# Patient Record
Sex: Female | Born: 1975 | Race: White | Hispanic: No | Marital: Married | Smoking: Never smoker
Health system: Southern US, Community
[De-identification: ages and names within clinical notes are randomized; demographics above are authoritative.]

## PROBLEM LIST (undated history)

## (undated) DIAGNOSIS — Z9081 Acquired absence of spleen: Secondary | ICD-10-CM

## (undated) DIAGNOSIS — E7522 Gaucher disease: Secondary | ICD-10-CM

## (undated) HISTORY — DX: Gaucher disease: E75.22

## (undated) HISTORY — PX: SPLENECTOMY, PARTIAL: SHX787

## (undated) HISTORY — PX: TOE SURGERY: SHX1073

## (undated) HISTORY — PX: WISDOM TOOTH EXTRACTION: SHX21

---
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
## 2000-02-20 ENCOUNTER — Encounter (HOSPITAL_COMMUNITY): Admission: RE | Admit: 2000-02-20 | Discharge: 2000-05-20 | Payer: Self-pay | Admitting: Family Medicine

## 2000-07-21 ENCOUNTER — Other Ambulatory Visit: Admission: RE | Admit: 2000-07-21 | Discharge: 2000-07-21 | Payer: Self-pay | Admitting: Obstetrics & Gynecology

## 2001-07-14 ENCOUNTER — Other Ambulatory Visit: Admission: RE | Admit: 2001-07-14 | Discharge: 2001-07-14 | Payer: Self-pay | Admitting: Obstetrics and Gynecology

## 2002-03-04 ENCOUNTER — Ambulatory Visit (HOSPITAL_COMMUNITY): Admission: RE | Admit: 2002-03-04 | Discharge: 2002-03-04 | Payer: Self-pay | Admitting: Obstetrics and Gynecology

## 2002-03-04 ENCOUNTER — Encounter: Payer: Self-pay | Admitting: Obstetrics and Gynecology

## 2002-06-14 ENCOUNTER — Ambulatory Visit (HOSPITAL_COMMUNITY): Admission: RE | Admit: 2002-06-14 | Discharge: 2002-06-14 | Payer: Self-pay | Admitting: Obstetrics and Gynecology

## 2002-06-14 ENCOUNTER — Encounter: Payer: Self-pay | Admitting: Obstetrics and Gynecology

## 2002-07-24 ENCOUNTER — Inpatient Hospital Stay (HOSPITAL_COMMUNITY): Admission: AD | Admit: 2002-07-24 | Discharge: 2002-07-27 | Payer: Self-pay | Admitting: Obstetrics and Gynecology

## 2002-07-24 ENCOUNTER — Inpatient Hospital Stay (HOSPITAL_COMMUNITY): Admission: AD | Admit: 2002-07-24 | Discharge: 2002-07-24 | Payer: Self-pay | Admitting: Obstetrics and Gynecology

## 2002-08-31 ENCOUNTER — Other Ambulatory Visit: Admission: RE | Admit: 2002-08-31 | Discharge: 2002-08-31 | Payer: Self-pay | Admitting: Obstetrics and Gynecology

## 2003-09-15 ENCOUNTER — Other Ambulatory Visit: Admission: RE | Admit: 2003-09-15 | Discharge: 2003-09-15 | Payer: Self-pay | Admitting: Obstetrics and Gynecology

## 2004-08-27 ENCOUNTER — Other Ambulatory Visit: Admission: RE | Admit: 2004-08-27 | Discharge: 2004-08-27 | Payer: Self-pay | Admitting: Obstetrics and Gynecology

## 2005-05-14 ENCOUNTER — Ambulatory Visit: Payer: Self-pay | Admitting: Internal Medicine

## 2005-08-07 ENCOUNTER — Other Ambulatory Visit: Admission: RE | Admit: 2005-08-07 | Discharge: 2005-08-07 | Payer: Self-pay | Admitting: Obstetrics and Gynecology

## 2005-11-25 HISTORY — PX: DILATION AND CURETTAGE OF UTERUS: SHX78

## 2006-10-02 ENCOUNTER — Other Ambulatory Visit: Admission: RE | Admit: 2006-10-02 | Discharge: 2006-10-02 | Payer: Self-pay | Admitting: Obstetrics and Gynecology

## 2006-10-17 ENCOUNTER — Ambulatory Visit (HOSPITAL_COMMUNITY): Admission: AD | Admit: 2006-10-17 | Discharge: 2006-10-17 | Payer: Self-pay | Admitting: Obstetrics and Gynecology

## 2006-10-17 ENCOUNTER — Encounter (INDEPENDENT_AMBULATORY_CARE_PROVIDER_SITE_OTHER): Payer: Self-pay | Admitting: Specialist

## 2007-04-20 ENCOUNTER — Encounter: Payer: Self-pay | Admitting: Internal Medicine

## 2007-06-26 ENCOUNTER — Ambulatory Visit (HOSPITAL_COMMUNITY): Admission: RE | Admit: 2007-06-26 | Discharge: 2007-06-26 | Payer: Self-pay | Admitting: Obstetrics and Gynecology

## 2007-07-17 ENCOUNTER — Ambulatory Visit (HOSPITAL_COMMUNITY): Admission: RE | Admit: 2007-07-17 | Discharge: 2007-07-17 | Payer: Self-pay | Admitting: Obstetrics and Gynecology

## 2007-08-14 ENCOUNTER — Inpatient Hospital Stay (HOSPITAL_COMMUNITY): Admission: AD | Admit: 2007-08-14 | Discharge: 2007-08-14 | Payer: Self-pay | Admitting: Obstetrics and Gynecology

## 2007-08-14 ENCOUNTER — Encounter: Payer: Self-pay | Admitting: Obstetrics and Gynecology

## 2007-09-02 ENCOUNTER — Inpatient Hospital Stay (HOSPITAL_COMMUNITY): Admission: AD | Admit: 2007-09-02 | Discharge: 2007-09-04 | Payer: Self-pay | Admitting: Obstetrics and Gynecology

## 2007-09-03 ENCOUNTER — Encounter: Payer: Self-pay | Admitting: Obstetrics and Gynecology

## 2007-09-06 ENCOUNTER — Inpatient Hospital Stay (HOSPITAL_COMMUNITY): Admission: AD | Admit: 2007-09-06 | Discharge: 2007-09-14 | Payer: Self-pay | Admitting: Obstetrics and Gynecology

## 2007-09-15 ENCOUNTER — Encounter: Admission: RE | Admit: 2007-09-15 | Discharge: 2007-10-15 | Payer: Self-pay | Admitting: Obstetrics and Gynecology

## 2007-10-16 ENCOUNTER — Encounter: Admission: RE | Admit: 2007-10-16 | Discharge: 2007-11-08 | Payer: Self-pay | Admitting: Obstetrics and Gynecology

## 2010-11-13 ENCOUNTER — Emergency Department (HOSPITAL_COMMUNITY)
Admission: EM | Admit: 2010-11-13 | Discharge: 2010-11-14 | Payer: Self-pay | Source: Home / Self Care | Admitting: Emergency Medicine

## 2010-12-16 ENCOUNTER — Encounter: Payer: Self-pay | Admitting: Obstetrics and Gynecology

## 2011-02-04 LAB — RAPID URINE DRUG SCREEN, HOSP PERFORMED
Amphetamines: NOT DETECTED
Barbiturates: NOT DETECTED
Benzodiazepines: NOT DETECTED
Cocaine: NOT DETECTED
Opiates: NOT DETECTED
Tetrahydrocannabinol: NOT DETECTED

## 2011-02-04 LAB — DIFFERENTIAL
Basophils Absolute: 0 10*3/uL (ref 0.0–0.1)
Lymphocytes Relative: 22 % (ref 12–46)
Monocytes Absolute: 0.7 10*3/uL (ref 0.1–1.0)
Neutro Abs: 6.8 10*3/uL (ref 1.7–7.7)

## 2011-02-04 LAB — BASIC METABOLIC PANEL
BUN: 10 mg/dL (ref 6–23)
GFR calc non Af Amer: >60 mL/min (ref >60)

## 2011-02-04 LAB — CBC
HCT: 35.7 % — ABNORMAL LOW (ref 36.0–46.0)
RDW: 12.1 % (ref 11.5–15.5)
WBC: 9.8 10*3/uL (ref 4.0–10.5)

## 2011-02-04 LAB — URINALYSIS, ROUTINE W REFLEX MICROSCOPIC
Ketones, ur: 15 mg/dL — AB
Nitrite: NEGATIVE
Protein, ur: 30 mg/dL — AB
Specific Gravity, Urine: 1.03 (ref 1.005–1.030)
Urobilinogen, UA: 1 mg/dL (ref 0.0–1.0)

## 2011-02-04 LAB — GLUCOSE, CAPILLARY: Glucose-Capillary: 127 mg/dL — ABNORMAL HIGH (ref 70–99)

## 2011-02-08 IMAGING — CT CT HEAD W/O CM
1 series · 16 of 30 positions shown, 20 images · non-contrast
Comparison: None.

CLINICAL DATA: Seizures.

CT HEAD WITHOUT CONTRAST
TECHNIQUE: Contiguous axial images were obtained from the base of
the skull through the vertex without contrast.

[Series 2: head trauma 4.8 h37s · axial · 0.45mm/px · z∈[-93,+36]mm · 16 of 30 slices shown, 20 images]
[im 2/30  brain]
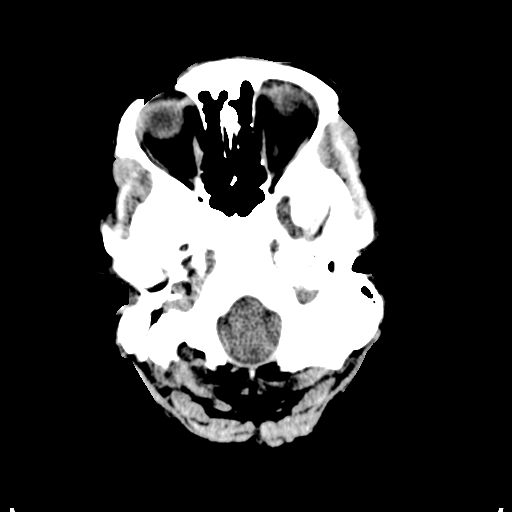
[im 2/30  bone]
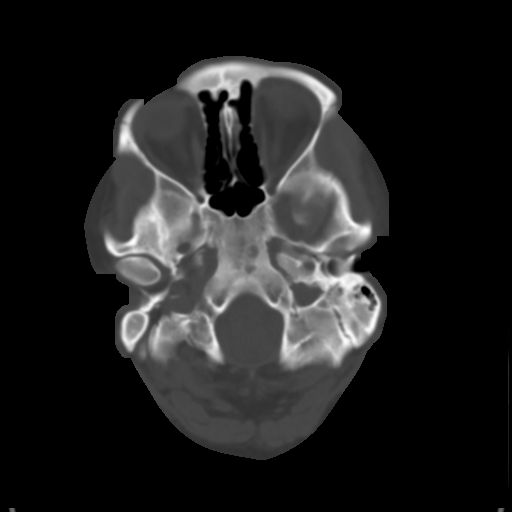
[im 4/30  brain]
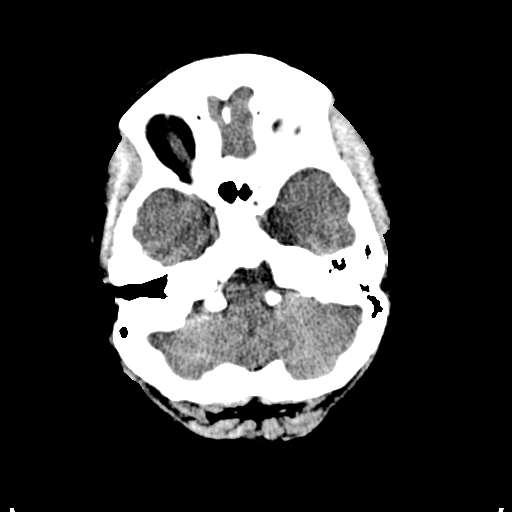
[im 6/30  brain]
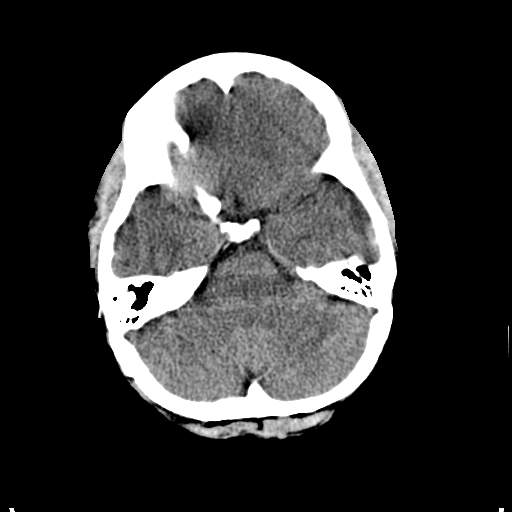
[im 8/30  brain]
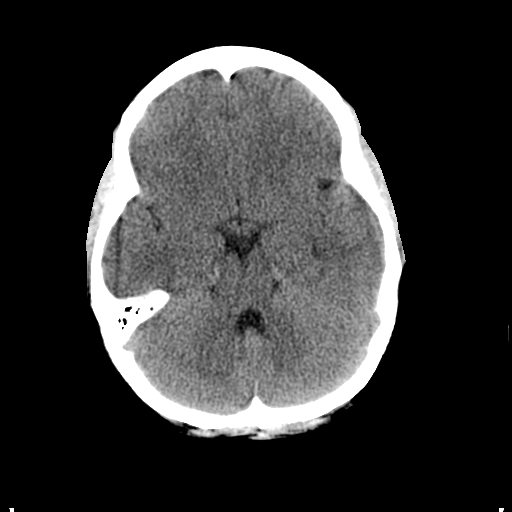
[im 9/30  brain]
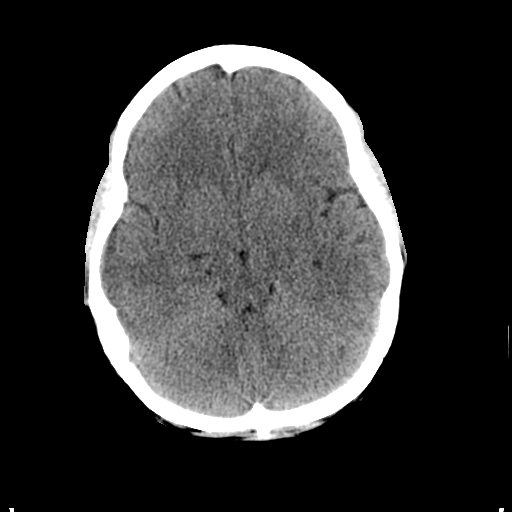
[im 9/30  bone]
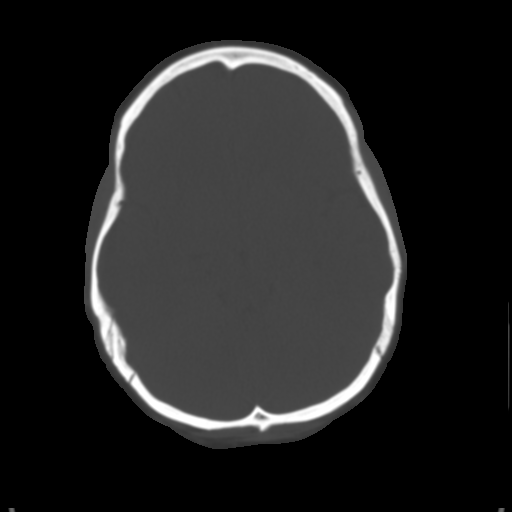
[im 11/30  brain]
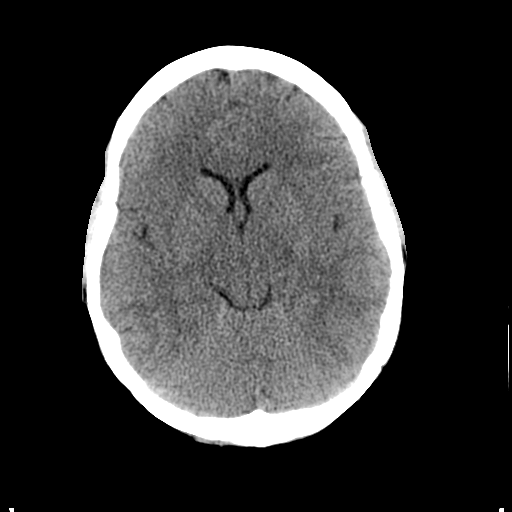
[im 13/30  brain]
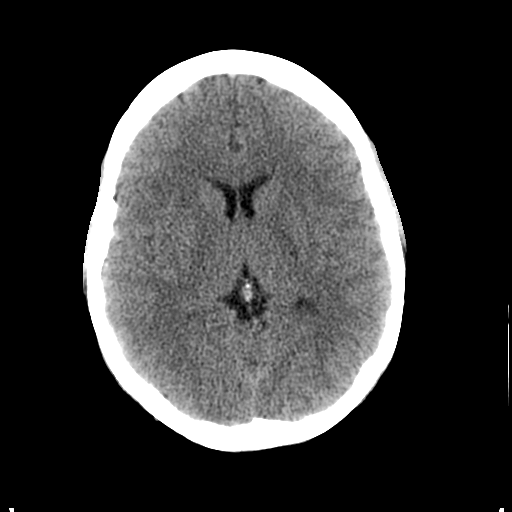
[im 15/30  brain]
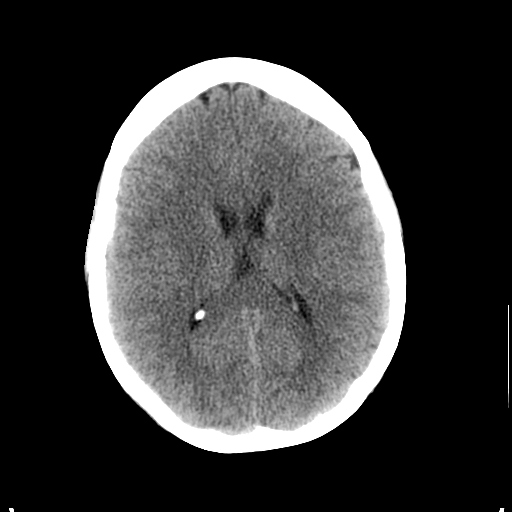
[im 16/30  brain]
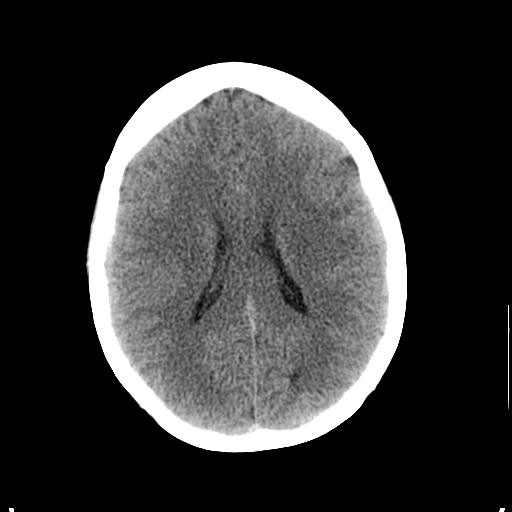
[im 16/30  bone]
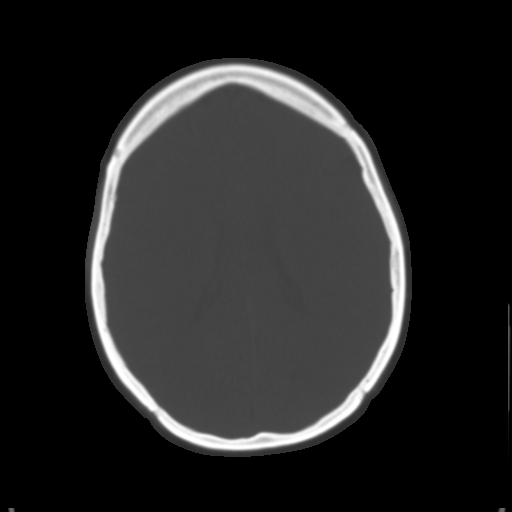
[im 18/30  brain]
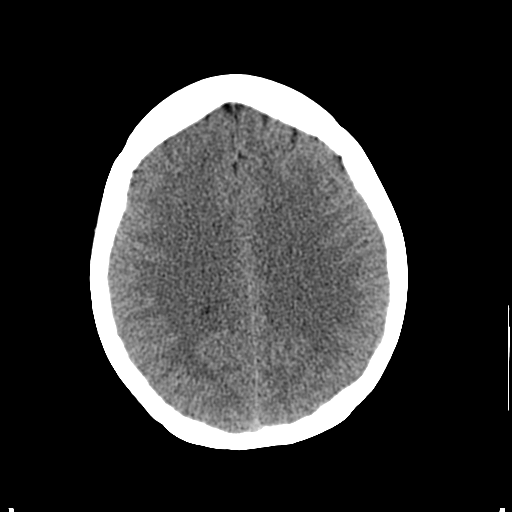
[im 20/30  brain]
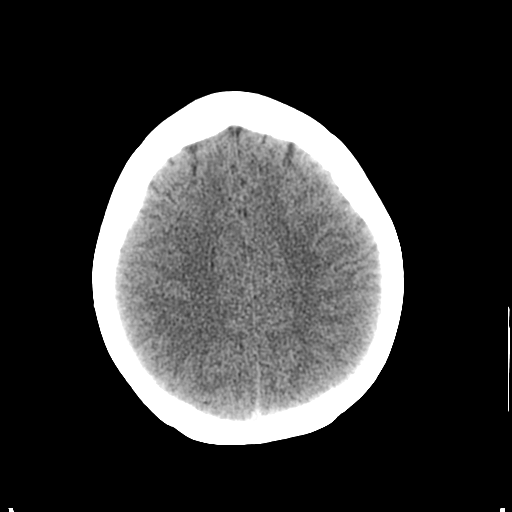
[im 22/30  brain]
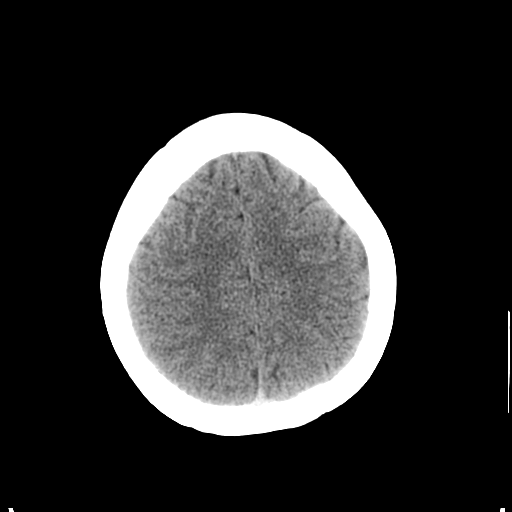
[im 23/30  brain]
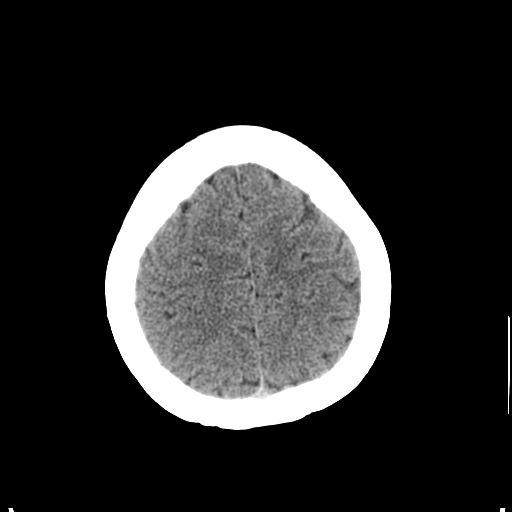
[im 23/30  bone]
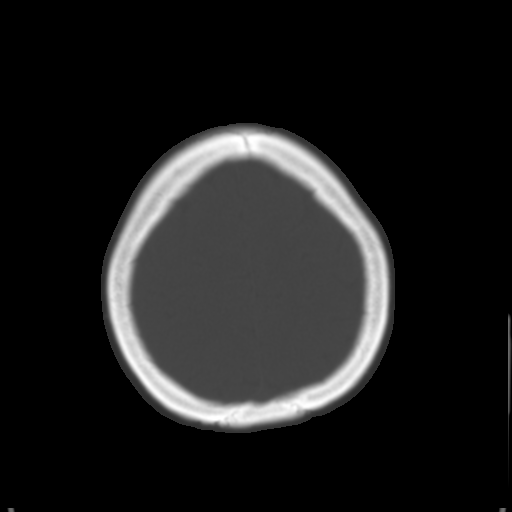
[im 25/30  brain]
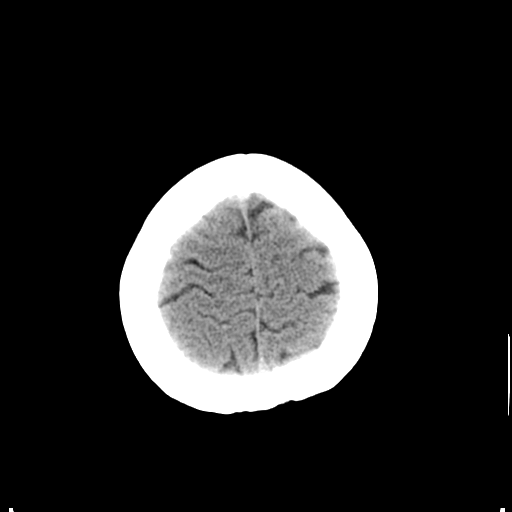
[im 27/30  brain]
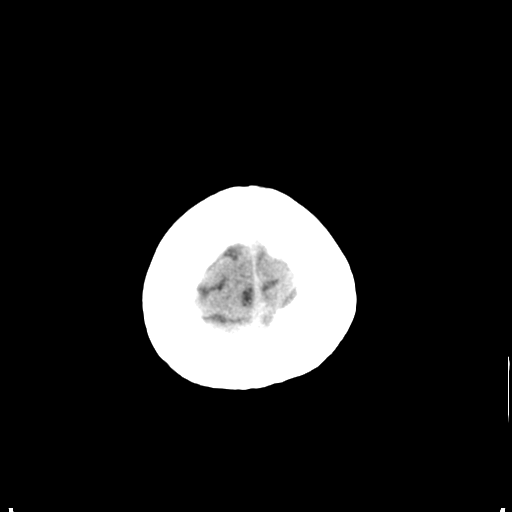
[im 29/30  brain]
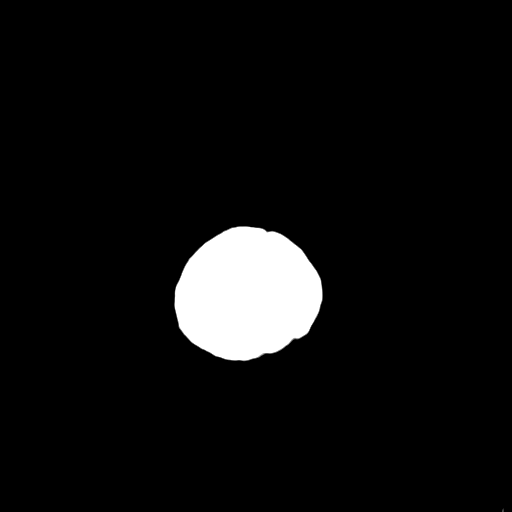

[16 of 30 positions shown; findings below may reference images not displayed]

FINDINGS: There is no evidence of acute infarction, mass lesion, or
intra- or extra-axial hemorrhage on CT.

The posterior fossa, including the cerebellum, brainstem and fourth
ventricle, is within normal limits.  The third and lateral
ventricles, and basal ganglia are unremarkable in appearance.  The
cerebral hemispheres are symmetric in appearance, with normal gray-
white differentiation.  No mass effect or midline shift is seen.

There is no evidence of fracture; visualized osseous structures are
unremarkable in appearance.  The visualized portions of the orbits
are within normal limits.  The paranasal sinuses and mastoid air
cells are well-aerated.  No significant soft tissue abnormalities
are seen.
IMPRESSION: Unremarkable noncontrast CT of the head.

## 2011-02-08 IMAGING — CR DG CHEST 1V
1 series · 1 of 1 positions shown · non-contrast
Comparison: None

CLINICAL DATA: Seizure.

CHEST - 1 VIEW

[w chest pa]
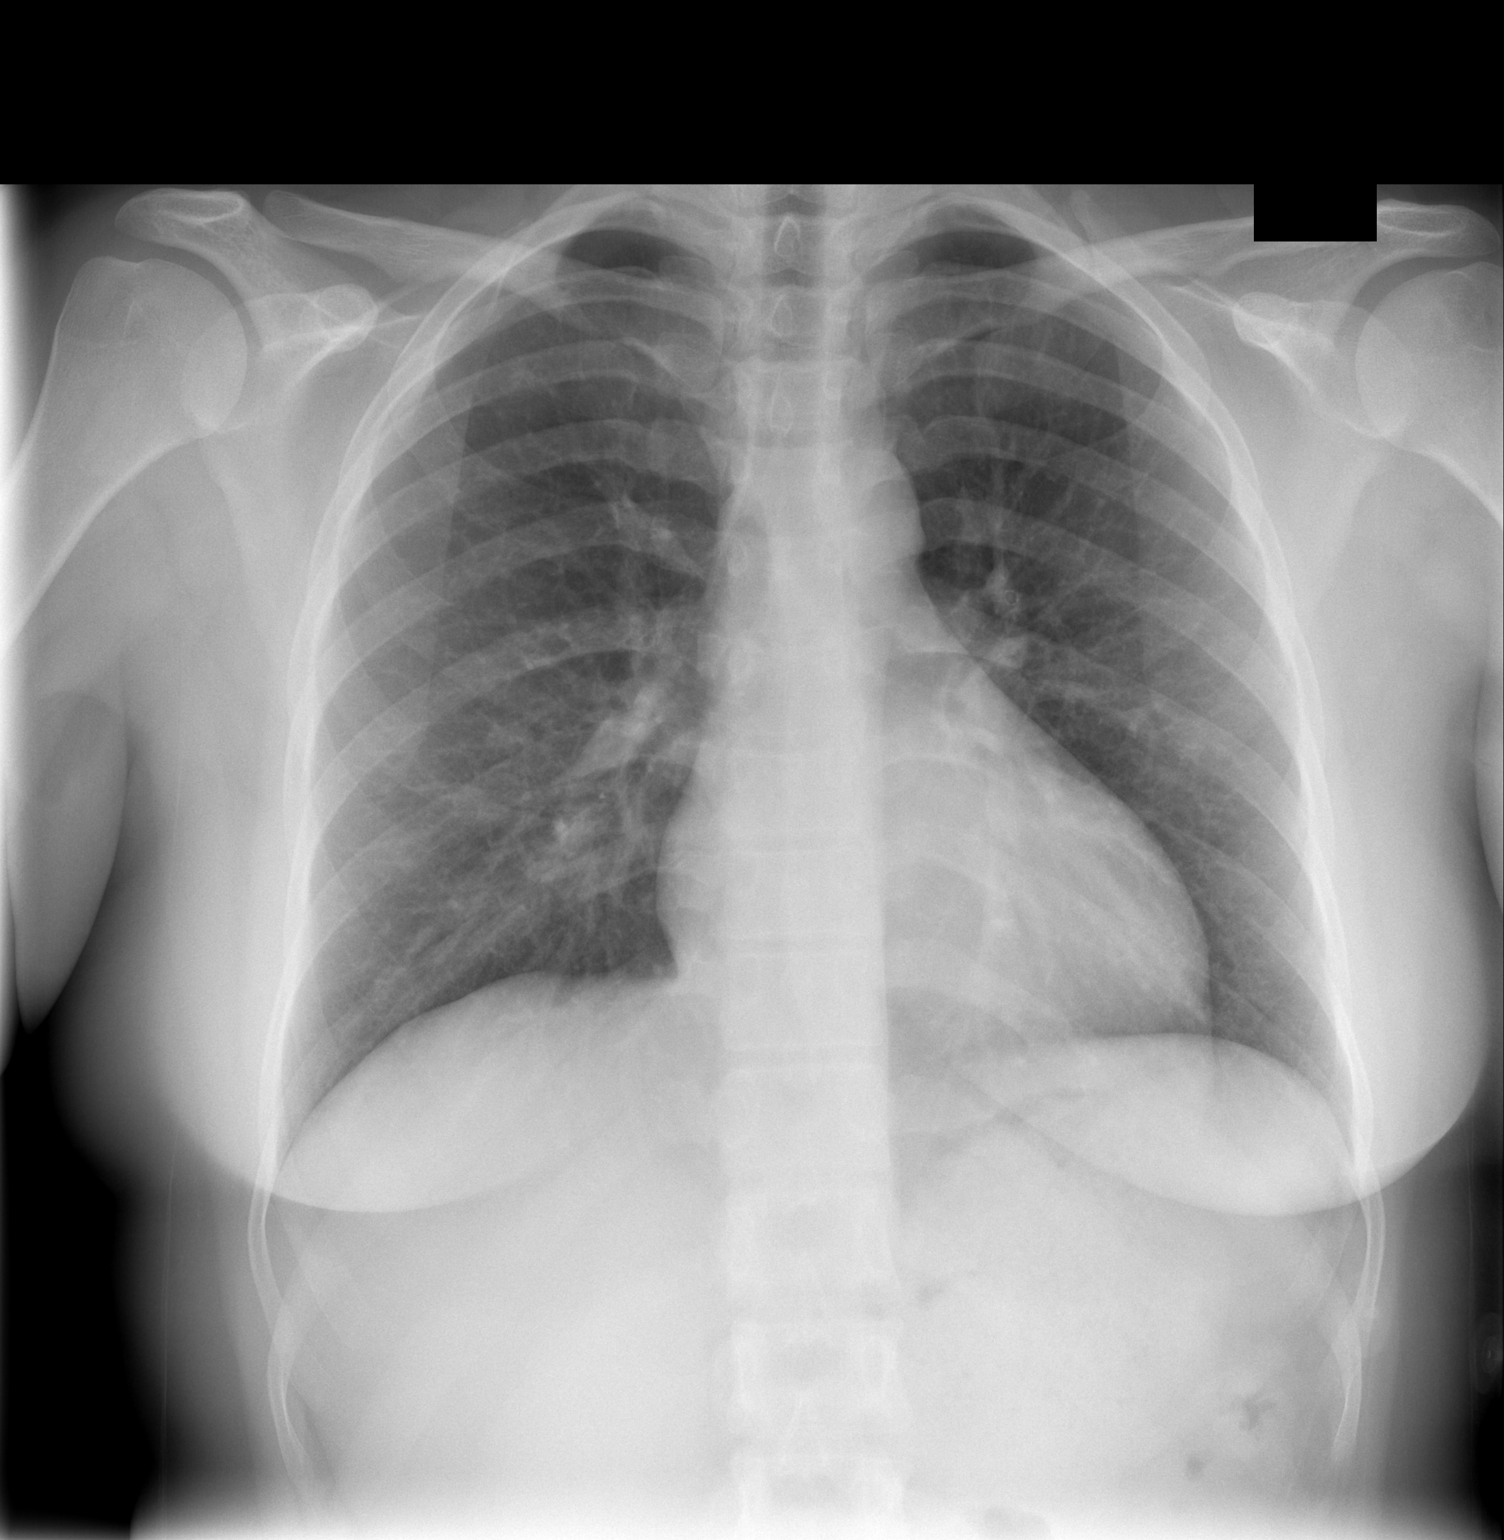

[1 of 1 positions shown; findings below may reference images not displayed]

FINDINGS: Mild peribronchial thickening. Heart and mediastinal
contours are within normal limits.  No focal opacities or
effusions.  No acute bony abnormality.
IMPRESSION: Mild bronchitic changes.

## 2011-04-09 NOTE — H&P (Signed)
NAMESHANEY, DECKMAN              ACCOUNT NO.:  [id number]   MEDICAL RECORD NO.:  [id number]          PATIENT TYPE:  INP   LOCATION:  9157                          FACILITY:  [facility]   PHYSICIAN:  Hal Morales, M.D.DATE OF BIRTH:  04-15-1976   DATE OF ADMISSION:  09/06/2007  DATE OF DISCHARGE:                              HISTORY & PHYSICAL   Ms. Elson is a 35 year old gravida 3, para 1-0-1-1 at 33-6/7 weeks who  presents today for admission secondary to elevated blood pressure.  Her  history has been remarkable for admission on September 02, 2007 to September 04, 2007, with the initial diagnosis of preeclampsia secondary to a 24-  hour urine with 300 mg protein.  She also was undergoing treatment for  UTI and was monitored.  Her blood pressures initially were elevated at  the 140s/90s.  These did then improve.  Uric acid on admission was 8.2,  and her platelet count was 181.  Her uric acid had gone down to 6.6.  Normal growth was noted.  The decision was made to give the patient the  option of going home to monitor her blood pressures t.i.d. and monitor  her weight, strict instructions for calling with specific blood pressure  parameters or noticing PIH symptoms.  The patient was also instructed to  call today for report of her values.   When she reported today, her blood pressures had been yesterday in the  130s/80s.  Today they were in the 140s-150s/90s with a home blood  pressure cuff.  She was brought to maternity admissions for recheck and  while her blood pressures by her home-bought cuff were higher than her  other, her pressures even with a large cuff remained largely in the 90s  diastolically.  She is therefore readmitted for further preeclamptic  care.  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  (2)  History of  partial splenectomy.  (3) Equivocal rubella.  (4)  Mild fetal  ventriculomegaly on  previous ultrasound but now resolved.  (5) Blood  pressures labile during her pregnancy.  (6) Now preeclampsia.   PRENATAL LABS:  Blood type is AB positive, Rh antibody negative, VDR  nonreactive, _rubella titer equivocal, hepatitis B surface antigen  negative, group B strep culture pending, HIV was nonreactive.  The  patient's 1st trimester screen that was normal.  AFP was also normal.  Her hemoglobin upon entering the practice was 11.6, platelet count was  221, CBC is reviewed at 23 weeks normal values, showed normal Glucola.   HISTORY OF PRESENT PREGNANCY:  The patient entered care at 10 weeks.  She had not been using her enzyme treatment for Gaucher's disease in the  1st trimester.  She had a 1st trimester screen which was normal.  She  restarted her Cerezyme at 13 weeks and was followed at [facility].  Her AFP  was normal.  She had an ultrasound at 19 weeks showing normal growth and  normal fluid.  Placenta was low-lying at that time, but cervical length  was normal.  At 23 weeks, she had another ultrasound for normal cervical  length and normal fluid.  Lateral ventricle was slightly enlarged.  She  was sent to maternity fetal modes for a 2nd opinion.  The lateral  ventricle was borderline enlarged on their exam.  Another ultrasound was  repeated at 27 weeks.  The ventricle was within the upper limits of  normal.  Estimated fetal weight was in the 53rd percentile with normal  fluid.  Glucola was given and was normal.  She had mild elevation of her  blood pressure beginning at 30 weeks and was placed on increased rest at  31 weeks.  She saw Dr. Stefano Gaul at that time when she was having some  swelling.  She had no blurred vision or headache.  On September 19, she  had an ultrasound at maternal fetal medicine, and showed a lateral  ventricle only mildly elevated but still within normal limits.  She was  given Ambien for insomnia.  She was placed on bed rest then for  reevaluation later that  week secondary to elevated blood pressures.  A  24-hour urine was done last week showing an elevation which precipitated  her admission.   OBSTETRICAL HISTORY:  1. In 2000, she had a vaginal birth of a female infant, weight 7      pounds 7 ounces, 38-1/2 weeks.  She was in labor 18 hours.  She had      no complications.  2. In 2000, she had a missed AB.   MEDICAL HISTORY:  1. She is a previous oral contraceptive user.  2. She reports the usual childhood illnesses.  3. She has a history of anemia.  4. She has occasional urinary tract infection.  5. She has had pyelonephritis x1.  6. 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   SURGICAL HISTORY:  Partial splenectomy at age 35 and wisdom teeth  removed at age 89.   SHE IS ALLERGIC TO SULFA AND HAS SOME MILD LATEX SENSITIVITY.   FAMILY HISTORY:  Maternal grandmother had an MI.  Maternal grandfather  had an angioplasty.  Multiple family members have hypertension.  Her  mother had anemia.  Maternal cousin has seizures.  A maternal  grandmother had PIH.  Her brother had breast cancer, and a paternal  grandfather had pancreatic cancer.   GENETIC HISTORY:  Remarkable for the previously noted Gaucher's disease.   SOCIAL HISTORY:  The patient is married to the father of the baby.  He  is involved and supportive.  His name is Vaness Jelinski.  The patient is  Caucasian of the [place] faith.  She is college-educated.  She is a  Emergency planning/management officer.  Her husband is also college-educated.  He is a Associate Professor.  She has been followed by the physician service at [facility].  She denies any alcohol, drug, or tobacco use during this  pregnancy.   PHYSICAL EXAM:  Blood pressures today are 140s-160/89-95.  Other vital  signs are stable.  HEENT:  Within normal limits.  LUNGS:  Breath sounds are clear.  HEART:  Regular rate and rhythm without murmur.  BREASTS:  Soft  and nontender.  ABDOMEN:  Fundal height is approximately 34 cm.  Abdomen is soft and  nontender.  There is no right upper quadrant tenderness.  EXTREMITIES:  Deep tendon reflexes are 2+ without clonus.  There is  a  trace to 1+ edema noted.  CERVICAL EXAM:  1-2, 50%, vertex, with a -2 station.  This is  essentially what the cervix has been.  Fetal fibronectin was done.  It  was positive; however, Dr. Pennie Rushing feels that regardless of this  outcome, the patient's labor would not be stopped should she self-  initiate preterm labor.  Fetal heart rate is reactive with no  decelerations and no contractions.   IMPRESSION:  1. Intrauterine pregnancy at 33-6/7 weeks.  2. Preeclampsia.  3. History of Gaucher's disease.   PLAN:  1. Admit to [facility] for consult with Dr.      Dierdre Forth as attending physician.  2. Routine physician orders.  3. Pregnancy-induced hypertension labs to be done.  4. NST q.8h. hours will be done.  5. Close observation for any worsening of any symptoms will be noted.      Renaldo Reel Emilee Hero, C.N.M.      Hal Morales, M.D.  Electronically Signed    VLL/MEDQ  D:  09/06/2007  T:  09/06/2007  Job:  [id number]

## 2011-04-09 NOTE — Discharge Summary (Signed)
Yesenia Nichols, Yesenia Nichols              ACCOUNT NO.:  [id number]   MEDICAL RECORD NO.:  [id number]          PATIENT TYPE:  INP   LOCATION:  9159                          [facility]ACILITY:  [facility]   PHYSICIAN:  Hal Morales, M.D.DATE OF BIRTH:  Aug 27, 1976   DATE OF ADMISSION:  09/02/2007  DATE OF DISCHARGE:  09/04/2007                               DISCHARGE SUMMARY   ADMITTING DIAGNOSES:  1. Intrauterine pregnancy at 33-3/7ths weeks.  2. Proteinuria.  3. Gaucher's disease.   DISCHARGE DIAGNOSES:  1. Intrauterine pregnancy at 33-3/7ths weeks.  2. Proteinuria.  3. Gaucher's disease.   PROCEDURE:  Dexamethasone therapy.   HOSPITAL COURSE:  The patient is a 35 year old gravida 3, para 1, 0, 1,  1 at 33-2/7ths weeks who presented from the office on September 02, 2007  for admission secondary to proteinuria.  She also had an E coli UTI and  her blood pressure was also elevated.  The pregnancy has been remarkable  for:  1. 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  2. History of partial splenectomy.  3. Equivocal rubella.  4. Mild fetal ventriculomegaly on previous ultrasound.  5. Blood pressures labile on previous visits.   On admission the blood pressure was 148/94 and 152/98, and other vital  signs were stable.  Fetal heart rate was reactive with no decelerations.  There were very occasional uterine contractions noted.  Her uric acid  was 8.2 from the office.  Her physical exam was otherwise within normal  limits.  She was admitted to the antenatal unit for consult with Dr.  Normand Sloop.  Continuous monitoring was initiated.  A dexamethasone course  was given.  The patient was scheduled for an ultrasound with a BPP and  AFI by maternal fetal medicine on September 03, 2007.  Repeat PIH labs  were done in the morning.  She was placed on amoxicillin for the UTI  with an initial plan made to repeat a 24-hour urine on September 10, 2007  and with a repeat urine  culture at that time.   Over the rest the 2 days that she was in the hospital the patient's  blood pressure eventually stabilized.  Her CBC on September 03, 2007 was  within normal limits with a platelet count of 181,000.  Her uric acid  was down to 6.6.  Her SGOT and SGPT were normal.   The ultrasound was scheduled.  A NICU consult was obtained.  Ultrasound  on September 03, 2007 showed a BPP of 8/8, normal anatomy, and normal  fluid and growth at the 74th percentile.  Her blood pressures were in  the 120s to 140s over 70s to 93.  Her weight was stable.  Dr. Stefano Gaul  reviewed with the patient the natural history of preeclampsia.  Discussion was held regarding magnesium sulfate therapy and the decision  was made to not administer magnesium at this time.  She completed the  dexamethasone course.  Labs were repeated on September 04, 2007 showing a  hemoglobin of 11.2, platelet count of 156,000, SGOT and SGPT were  normal,  and uric acid was 6.8.   Dr. Ander Slade was consulted from maternal fetal medicine.  The question was  raised regarding outpatient versus inpatient management from  Dr. Ronnell Freshwater  consult.  He, after discussion with the patient, did feel the patient  could be managed at home as long as she monitored her blood pressure and  her weight with a plan made for close antenatal follow up, and the  patient was to be on bedrest.  There were also plans made for antenatal  testing and repeat 24-hour urine, and labs.  The patient initially had  decided not to go home and then decided she did want to.  Dr. Pennie Rushing  was consulted and saw the patient.  The decision was made to discharge  the patient home in light of the recommendations from maternal fetal  medicine and the patient's stability of blood pressure.  The patient was  deemed to have received  full benefit of her hospital stay and was  discharged home.   DISCHARGE INSTRUCTIONS:  1. The patient is to maintain level 2 bedrest.  2. The  patient is to monitor and call for any PIH symptoms.  3. The patient is also to weigh herself daily and take her blood      pressure three times a day.  She is to call with these results on      "Sunday,  September 06, 2007  to the nurse midwife on call.   DISCHARGE MEDICATIONS:  The patient will continue her primary admission  medications which included:  1. Ambien 10 mg 1 by mouth at bedtime as needed.  2. Zantac 150 mg 1 by mouth twice a day.  3. Cerezyme 10 mg IV every 2 weeks.  4. Prenatal vitamin 1 by mouth daily.  5. The patient's new medication is amoxicillin 500 mg by mouth twice a      day for an additional 3 days to complete a total of 5 days therapy.      This prescription was called to the [facility] at [place].   FOLLOW UP:  Discharge follow up is as follows:  1. The patient will have an NST and a visit in the office on October      13" , 2008;  the office will call to schedule this.  2. On September 10, 2007 she will have an ultrasound for BPP, AFI and      Dopplers.  3. The patient is to begin a 24-hour urine on September 09, 2007 to be      completed on the morning of September 10, 2007, and bring this to the      office visit on that day.  4. We will also repeat urine C&S on that day as well and as well we      will repeat PIH labs.   DISCHARGE INSTRUCTIONS:  The patient is to call for headache, blurry  vision or epigastric pain, or blood pressure greater than 150 systolic  of greater than 90 diastolic.  She is also monitor for fetal movement  and any onset of significant contractions.   DISCHARGE CONDITION:  The patient was discharged home in stable  condition.      Renaldo Reel Emilee Hero, C.N.M.      Hal Morales, M.D.  Electronically Signed   VLL/MEDQ  D:  09/04/2007  T:  09/06/2007  Job:  [id number]

## 2011-04-09 NOTE — H&P (Signed)
NAMESANAII, CAPORASO              ACCOUNT NO.:  [id number]   MEDICAL RECORD NO.:  [id number]          PATIENT TYPE:  INP   LOCATION:  9159                          [facility]ILITY[facility]  [facility]   PHYSICIAN:  Naima A. Dillard, M.D. DATE OF BIRTH:  [birth year]   DATE OF ADMISSION:  09/02/2007  DATE OF DISCHARGE:                              HISTORY & PHYSICAL   Ms. Yesenia Nichols is a 35 year old gravida 3, para 1-0-0-1, at 33-2/7 weeks, who  presented from the office for admission secondary to proteinuria.  She  has also had an E. coli UTI.  She has had labile blood pressures and on  her PIH labs that were done recently her uric acid was 8.2.  She denies  any PIH symptoms and reports positive fetal movement.  The pregnancy has  been remarkable for:   1. Gaucher disease.  2. History of partial splenectomy.  3. Equivocal rubella.  4. Mild fetal ventriculomegaly on previous ultrasounds.   PRENATAL LABS:  Blood type is AB positive, Rh antibody negative.  VDRL  nonreactive.  Rubella titer is equivocal.  Hepatitis B surface antigen  is negative.  Group B strep culture is pending.  HIV was nonreactive.  The patient had a first trimester screen that was normal.  AFP was also  normal.  Her hemoglobin upon entering the practice was 11.6, platelet  count was 221.  A CBC was repeated at 23 weeks with normal values.  She  had a normal Glucola.   HISTORY OF PRESENT PREGNANCY:  The patient entered care at 10 weeks.  She had not been using her enzyme treatment for Gaucher disease in the  first trimester.  She did have some significant fatigue during that  time.  She had a first trimester screen, which was normal.  She  restarted her serozyme at 13 weeks and was followed at [facility].  Her AFP  was normal.  She had an ultrasound at 19 weeks showing normal growth and  normal fluid.  Placenta was low-lying at that time.  Cervix was normal.  At 23 weeks she had another ultrasound with normal cervical length,  normal fluid.  The  lateral ventricle was slightly enlarged.  She was  sent to maternal-fetal medicine for a second opinion.  The lateral  ventricle was borderline enlarged.  Another ultrasound was repeated at  27 weeks.  The ventricles were in the upper limits of normal.  Estimated  fetal weight was in the 53rd percentile with normal fluid.  Glucola was  given and was normal.  She had mild elevation of her blood pressure at  30 weeks, began to have some increase rest at 31-2/7 weeks.  She saw Dr.  Stefano Gaul.  She was still having some swelling.  She had no blurred  vision or headache.  On September 19 she had an ultrasound at maternal-  fetal medicine showing the lateral ventricle only mildly elevated but  still was within normal limits.  She was given Ambien for insomnia at  that time.  She was placed on rest and then for reevaluation later in  that week rest.  The rest of her pregnancy has been uncomplicated.  She  did continue to have some lability of her blood pressure and therefore  the 24-hour urine was done showing elevated protein, approximately 300  mg in a 24-hour specimen.  She is therefore admitted for further  evaluation and follow-up.   OBSTETRICAL HISTORY:  In 2003 she had a vaginal birth of a female  infant, weight 7 pounds 7 ounces, at 38-1/2-weeks.  She was in labor 18  hours.  She had no complications.  In 2000 she had a missed AB.   MEDICAL HISTORY:  She is previous oral contraceptive user.  She reports  the usual childhood illnesses.  She does have a history of anemia.  She  had occasional urinary tract infections.  She has had pyelonephritis x1.  She does have a history of Gaucher disease, for which she has been  receiving enzyme treatment on an every 2-week basis.   SURGICAL HISTORY:  A partial splenectomy at age 70 and her wisdom teeth  removed at 87.   She is allergic to SULFA and some mild LATEX sensitivity.   FAMILY HISTORY:  Maternal grandmother had an MI.  Maternal  grandfather  had angioplasty.  Multiple family members have hypertension.  Her mother  has anemia.  A maternal cousin has seizures.  Her maternal grandmother  mother had TIAs.  Her mother had breast cancer and her paternal  grandfather had pancreatic cancer.   GENETIC HISTORY:  Remarkable for the previously-noted Gaucher disease.   SOCIAL HISTORY:  The patient is married to the father of the baby.  He  is involved and supportive.  His name is Yesenia Nichols.  The patient is  Caucasian, of the [place] faith.  She is college-educated.  She is a  Emergency planning/management officer.  Her husband is also college-educated.  He is a Associate Professor.  She has been followed by the physician service at [facility].  She denies any alcohol, drug or tobacco use during this  pregnancy.   PHYSICAL EXAM:  Blood pressures are 148/94, 152/98.  Other vital signs  are stable.  HEENT:  Within normal limits.  LUNGS:  Bilateral breath sounds are clear.  HEART:  Regular rate and rhythm without murmur.  BREASTS:  Soft and nontender.  ABDOMEN:  The fundal height is approximately 34 cm.  PELVIC:  1 cm, 50%, vertex at a -2 station in the office today.  Fetal  heart rate is reactive with no decelerations.  There was one uterine  contraction noted on the monitor.  EXTREMITIES:  Deep tendon reflexes were 2+ without clonus.  There is a  trace to 1+ edema noted in the lower extremities/   IMPRESSION:  1. Intrauterine pregnancy at 33-2/7 weeks.  2. Preeclampsia.  3. History of Gaucher disease.   PLAN:  1. Admit to antenatal unit per consult with Dr. Normand Sloop as attending      physician.  2. Continuously chronic fetal monitoring.  3. Dexamethasone course.  4. Ultrasound with BPP and AFI in maternal-fetal medicine in the      morning.  5. Repeat PIH labs in the morning.  6. Treat UTI with amoxicillin with a plan to repeat a 24-hour urine on      October 16 after a repeat UA and culture.  7. MDs will follow.      Renaldo Reel Emilee Hero, C.N.M.      Naima A. Normand Sloop, M.D.  Electronically Signed    VLL/MEDQ  D:  09/02/2007  T:  09/03/2007  Job:  [id number]

## 2011-04-09 NOTE — Discharge Summary (Signed)
NAMERAIDEN, YEARWOOD              ACCOUNT NO.:  [id number]   MEDICAL RECORD NO.:  [id number]          PATIENT TYPE:  INP   LOCATION:  9318                          [facility]ACILITY:  [facility]   PHYSICIAN:  Hal Morales, M.D.DATE OF BIRTH:  08-Sep-1976   DATE OF ADMISSION:  09/06/2007  DATE OF DISCHARGE:  09/14/2007                               DISCHARGE SUMMARY   ADMISSION DIAGNOSES:  1. Intrauterine pregnancy at 33-6/7 weeks.  2. Preeclampsia.   DISCHARGE DIAGNOSES:  1. Intrauterine pregnancy at 33-6/7 weeks.  2. Preeclampsia.  3. Status post spontaneous vaginal delivery of a female infant named      Maisie Fus, Apgars 9 and 9, weighing 5 pounds 8 ounces.  4. Terminal abruptio.  5. Nuchal cord x1.  6. Vulvar laceration.   PROCEDURE:  1. Electronic fetal monitoring.  2. Spontaneous vaginal delivery.  3. Epidural anesthesia.  4. Magnesium sulfate administration with AICU care.   HOSPITAL COURSE:  The patient was admitted with elevated blood pressures  and preeclampsia evidenced by proteinuria on a 24-hour urine specimen.  Blood pressures on admission were 150s/90s.  She was placed on bed rest  and PIH labs were done.  She had routine care and observation over the  next several days.  Her labs were followed as time went on.  On September 10, 2007, blood pressures were 130s to 150s over 87 to 102.  24-hour  urine was in process.  Labs were normal except for an increased uric  acid at 8.0 and decreased platelets at 134.  Dr. Katrinka Blazing from maternal  fetal medicine was consulted and decision was made that based on her  variable hypertension, slowly decreasing platelet count, and increasing  uric acid in the setting of proteinuria and the fact that the patient  was status post steroid administration, that she would have her labor  induced.  During labor she progressed rapidly over four hours to  delivery under epidural anesthesia of a female infant, named Ralene Cork, Apgars 9 and 9, weighing 5  pounds 8 ounces.  Baby was taken to  NICU per protocol because he was 34-3/7 weeks.  Placenta was noted to  have a terminal abruptio and one loose nuchal cord was also noticed at  delivery.  Vulvar laceration was repaired.  The patient was taken to  AICU for magnesium sulfate administration and on postpartum day #1 she  was doing well.  Blood pressures were 120-150 over 70-90.  Labs were  stable with platelets at 135.  Routine care was given.  On postpartum  day #2, blood pressures were 130-160 over 73-100.  Oxygen saturation was  98%. Lochia was small.  I&O was sufficient with small amount of  diuresis.  Labs were stable.  She was changed from labetalol to  Procardia.  On postpartum day #3, blood pressures were 130-140 over 70-  90.  Procardia was increased to 60 and then to 90 of Procardia XL.  On  postpartum day #4 she was doing well, breastfeeding and pumping.  Vital  signs were 124-139 over 86-92.  Chest was clear, heart regular rate  and  rhythm.  DTR's were 2+ with 1+ edema.  Fundus was firm, lochia was  small, and perineum was healing.  Hemoglobin was 13.0, platelets 208.  She was deemed to have received the full benefit of her hospital stay  and was discharged home.   DISCHARGE MEDICATIONS:  1. Motrin 600 mg p.o. q.6 hours p.r.n.  2. Procardia XL 90 mg p.o. daily.   DISCHARGE LABORATORY DATA:  White blood cell count 9.9, hemoglobin 13.0,  platelets 208.   DISCHARGE INSTRUCTIONS:  Per [facility] handout with additional PIH  precautions reviewed.   FOLLOWUP:  Will occur with Smart Start visit at home within the next 1-2  days and then a blood pressure check and physician visit on Monday,  November 3, at 3:15 with Dr. Pennie Rushing.  Additional visits will be as  needed.   CONDITION ON DISCHARGE:  Good.      Marie L. Williams, C.N.M.      Hal Morales, M.D.  Electronically Signed    MLW/MEDQ  D:  09/14/2007  T:  09/15/2007  Job:  [id number]

## 2011-04-12 NOTE — Op Note (Signed)
NAMESOPHIANA, MILANESE              ACCOUNT NO.:  [id number]   MEDICAL RECORD NO.:  [id number]          PATIENT TYPE:  MAT   LOCATION:  MATC                          [facility]:  [facility]   PHYSICIAN:  Crist Fat. Rivard, M.D. DATE OF BIRTH:  1976-04-17   DATE OF PROCEDURE:  10/17/2006  DATE OF DISCHARGE:                               OPERATIVE REPORT   PREOPERATIVE DIAGNOSIS:  Missed abortion.   POSTOPERATIVE DIAGNOSIS:  Missed abortion.   ANESTHESIA:  IV sedation and paracervical block.   PROCEDURE:  D and E.   SURGEON:  Sandra A. Rivard, M.D.   ASSISTANT:  None.   ESTIMATED BLOOD LOSS:  Minimal.   PROCEDURE:  After being informed of the planned procedure with possible  complications including bleeding, infection, retained products of  conception or injury to uterus, informed consent is obtained.  The  patient is taken to OR #3, given IV sedation and placed in the lithotomy  position.  She is prepped and draped in a sterile fashion and her  bladder is then emptied with an in-and-out nonlatex catheter.  Pelvic  exam reveals an anteverted uterus 10-12 weeks size, regular contours and  two normal adnexa.  Cervix is closed.   We insert the weighted speculum and grasped the anterior lip of the  cervix with a Jacobs forceps.  We then perform a paracervical block  using Nesacaine 1% 17 mL in the usual fashion.  Uterus is sounded at 13  cm and the cervix is easily dilated using Hegar dilator until #29 which  allows easy entry of a #9 curved cannula for evacuation.   We then suction and evacuate products of conception.  After evacuation  is completed, a sharp curette is used to assess the uterine walls which  feels free of tissue including both cornua.   Instruments were then removed.  Instruments and sponge count is complete  x2.  Estimated blood loss is minimal and hemostasis is adequate.  The  procedure is very well tolerated by the patient who is taken to recovery  room in a well and  stable condition.      Crist Fat Rivard, M.D.  Electronically Signed     SAR/MEDQ  D:  10/17/2006  T:  10/17/2006  Job:  [id number]

## 2011-04-12 NOTE — H&P (Signed)
Yesenia Nichols, PLATTS              ACCOUNT NO.:  [id number]   MEDICAL RECORD NO.:  [id number]          PATIENT TYPE:  AMB   LOCATION:                                [facility]   PHYSICIAN:  Dois Davenport A. Rivard, M.D. DATE OF BIRTH:  1976/03/09   DATE OF ADMISSION:  DATE OF DISCHARGE:                                HISTORY & PHYSICAL   HISTORY OF PRESENT ILLNESS:  Ms. Ingber is a 35 year old married white  female, gravida 2, para 1-0-0-1, with a history of Gaucher disease, who  presents for a dilatation and evacuation because of a missed abortion.  This  patient, whose last menstrual period was July 27, 2006, was seen on  September 03, 2006, at [facility] with complaints of brown  spotting of 3 days' duration accompanied by a pulling sensation on her  right side.  She denied at that time any cramping, urinary tract symptoms,  changes in her bowel habits, fever or back pain.  She had had a positive  urine home pregnancy test earlier that week and since her quantitative hCG  was found to be 2626.4, an ultrasound was performed on September 04, 2006.  At  that time the patient was found to have an intrauterine gestational sac of 5  weeks 3 days with a right corpus luteum cyst.  Previously the patient had  received her Cerezyme for her Gaucher disease on October 4 and had planned  not to receive any additional therapy until after her first trimester was  completed.  The patient's blood type was AB positive, her antibody screen  negative, and gonorrhea and chlamydia cultures also negative.  A follow-up  pelvic ultrasound on September 11, 2006, showed a 6 week 3 day intrauterine  gestation with a fetal heart rate of 175 beats per minute.  By this time the  patient's spotting was occurring in a very rare fashion.  On November 20 the  patient reported an episode of blood-tinged mucoid vaginal drainage without  cramping, which occurred the previous evening but had resolved by that  morning.  In spite of this, however, another pelvic ultrasound was performed  and it was determined that she had an intrauterine pregnancy of 8 weeks 5  days without any fetal heart rate identified.  A discussion was held with  the patient regarding first trimester pregnancy losses and the option of  surgical or expectant management.  After much consideration, the patient has  decided to proceed with surgical intervention in the form of dilatation and  evacuation.   PAST MEDICAL HISTORY:  OB history:  Gravida 2, para 1-0-0-1.  The patient  had one spontaneous vaginal delivery in 2003 (Annabelle).   GYN history:  Menarche 35 years old.  Last menstrual period July 27, 2006.  The patient's menstrual periods were regular.  She currently is not  using any method of contraception.  Denies any history of sexually  transmitted diseases or history of abnormal Pap smears.  The patient's last  normal Pap smear was July 31, 2005.   Medical history:  Gaucher disease, anemia, postpartum  depression, and  pyelonephritis.   Surgical history:  In 1985, partial splenectomy.   FAMILY HISTORY:  Cardiovascular disease, hypertension, Crohn disease,  migraines, asthma, stroke, breast cancer (mother, age 58).   SOCIAL HISTORY:  The patient is married, and she works as a Geologist, engineering.   Habits:  She does not use tobacco and rarely consumes alcohol.   CURRENT MEDICATIONS:  Prenatal vitamins.   ALLERGIES:  The patient is allergic to LATEX and a questionable allergy to  SULFA (the patient's mother and sister both have violent reactions to  SULFA).   REVIEW OF SYSTEMS:  Contact lenses, occasional heartburn, occasional  lightheadedness.  Denies, however, chest pain, shortness of breath, fever,  diarrhea, headache, vision changes, and except as mentioned in the history  of present illness, her review of systems is negative.   PHYSICAL EXAMINATION:  VITAL SIGNS:  Blood pressure  110/80.  Weight is 212.  Height is 5 feet 7-3/4 inches tall.  NECK:  Supple.  There are no masses, thyromegaly or cervical adenopathy.  CARDIAC:  Regular rate and rhythm.  LUNGS:  Clear to auscultation.  ABDOMEN:  Nontender without guarding, without rebound, and without  organomegaly.  PELVIC:  EG, BUS is within normal limits.  Vagina is rugous.  Cervix is  nontender without lesions.  The uterus appears approximately 8-10 weeks'  size, nontender.  Do note that the cervix is long and closed, by the way.  Adnexa without tenderness or masses.   IMPRESSION:  Missed abortion.   DISPOSITION:  A discussion is held with the patient regarding the  indications for her procedure along with its risks, which include but are  not limited to reaction to anesthesia, damage to adjacent organs, infection,  excessive bleeding, and the risk of endometrial scarring.  The patient  accepts the risks of this procedure and has consented to proceed with a  dilatation and evacuation at [facility] on Friday,  October 17, 2006.      [place] J. Adline Peals.      Crist Fat Rivard, M.D.  Electronically Signed    EJP/MEDQ  D:  10/15/2006  T:  10/15/2006  Job:  [id number]

## 2011-04-12 NOTE — H&P (Signed)
NAME:  Yesenia Nichols, Yesenia Nichols                        ACCOUNT NO.:  [id number]   MEDICAL RECORD NO.:  [id number]                   PATIENT TYPE:  INP   LOCATION:  9165                                 [facility]ILITY:  [facility]   PHYSICIAN:  Cam Hai, C.N.M.               DATE OF BIRTH:  1975-12-14   DATE OF ADMISSION:  07/24/2002  DATE OF DISCHARGE:                                HISTORY & PHYSICAL   HISTORY:  The patient is a 35 year old married white female primigravida at  38-2/7ths weeks' who presents with large gush of clear fluid at  approximately 8:00 p.m. this evening.  She reports mild uterine contractions  but no bleeding.  No headache or nausea or vomiting since then.  She reports  positive fetal moment.  Her pregnancy has been followed by [facility]  OB[facility]GYN MD Service and is remarkable for:  1. Gaucher's disease.  2. Chronic anemia.  3. History of partial splenectomy.  4. Group B Strep negative.   Her prenatal laboratories were collected on January 07, 2002.  Hemoglobin  was 12.6, hematocrit 37.2, platelets 240,000, blood type AB+, antibody  negative, RPR nonreactive, rubella immune, hepatitis B surface antigen  negative, Pap smear from August 2002 within normal limits, gonorrhea  negative, Chlamydia negative.  On May 03, 2002 her one-hour Glucola was  within normal limits with a hemoglobin of 10.7.  Culture of the vaginal  tract for Group B Strep on July 07, 2002 was negative.   HISTORY OF PRESENT PREGNANCY:  She presented for care at approximately 11  weeks' gestation on January 07, 2002.  She reports being followed by  genetics at [facility] secondary to her history of Gaucher's disease  for which she has received IV enzyme treatment throughout the pregnancy.  Pregnancy ultrasonography at approximately 18 weeks' gestation on March 04, 2002 showed normal anatomy survey with [facility] of August 05, 2002.  Her CBCs  drawn throughout the pregnancy have remained within  normal limits.  Pregnancy ultrasonography at 33 weeks' gestation showed growth in the 50th  and 75th percentile with normal amniotic fluid.  The rest of her prenatal  care was unremarkable.   OBSTETRICAL HISTORY:  She is a primigravida.   ALLERGIES:  Allergies to SULFA.  Vaginal burning with LATEX.   PAST MEDICAL HISTORY:  She has used Ortho Tri-Cyclen in the past for  contraception which she stopped in October 2002.  She has an occasional  yeast infection.  She reports having had usual childhood illnesses.  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  She has an occasional  urinary tract infection.  She had a kidney infection in high school.  She  has had surgery with partial splenectomy at the age of 8 as well as wisdom  teeth extraction.  She has received IV enzyme treatment for Gaucher's  disease since the age of 39.   FAMILY HISTORY:  Paternal grandmother has a history of myocardial  infarction.  Paternal grandfather died during angioplasty.  Father, maternal  grandmother and maternal grandfather have history of hypertension.  Father  and nephew had childhood asthma.  Maternal grandfather history of prostate  cancer.  Mother grandmother had a CVA after surgery.  Maternal first cousin  with a seizure disorder.  Two maternal first cousins with history of  depression.  Sister with history of depression.   GENETIC HISTORY:  Genetic history remarkable for patient with Gaucher's  disease as well as twins running in the father of the baby's family.   SOCIAL HISTORY:  She is married to the father of the baby.  His name is  Harrold Donath.  He is supportive.  Both patient and father of the baby have four  years of college education and are employed full time.  They deny any  alcohol, tobacco or elicit drug use throughout the pregnancy.   PHYSICAL EXAMINATION:  GENERAL:  Afebrile.  HEENT:  Grossly within normal limits.  CHEST:  Clear to auscultation.  HEART:  Regular rate and  rhythm.  ABDOMEN:  Gravid and nontender with fundal height extending approximately 38  cm above the pubic symphysis.  Electronic fetal heart rate monitoring is  remarkable for reactive and reassuring fetal heart rate.  Uterine  contractions irregular and mild, 13 minutes.  PELVIC:  Cervical examination is deferred.  Her cervix was 2.0, 75%, vertex,  -2 earlier this afternoon.  She has copious clear fluid present which is  positive for nitrazine and positive for ferning.  EXTREMITIES:  Within normal limits.   ASSESSMENT:  1. Intrauterine pregnancy at term.  2. Preterm rupture of membranes.  3. Group B Strep negative.   PLAN:  1. Admit to birthing unit for consult with Dr. Normand Sloop.  2. Routine MD orders.  3. Plan expectant management for now.  Risks and benefits of rupture of     membranes have been reviewed with patient.                                               Cam Hai, C.N.M.    KS/MEDQ  D:  07/24/2002  T:  07/25/2002  Job:  [id number]

## 2011-09-04 LAB — COMPREHENSIVE METABOLIC PANEL
ALT: 18
ALT: 18
ALT: 22
AST: 21
Albumin: 2.1 — ABNORMAL LOW
Albumin: 2.4 — ABNORMAL LOW
Alkaline Phosphatase: 106
Alkaline Phosphatase: 112
Alkaline Phosphatase: 118 — ABNORMAL HIGH
Alkaline Phosphatase: 90
BUN: 4 — ABNORMAL LOW
CO2: 21
CO2: 21
CO2: 23
Calcium: 7.5 — ABNORMAL LOW
Calcium: 8.1 — ABNORMAL LOW
Chloride: 105
Chloride: 106
Chloride: 108
Creatinine, Ser: 0.68
GFR calc Af Amer: >60
GFR calc non Af Amer: >60
GFR calc non Af Amer: >60
GFR calc non Af Amer: >60
Glucose, Bld: 71
Glucose, Bld: 84
Potassium: 3.5
Potassium: 3.8
Potassium: 4.1
Sodium: 133 — ABNORMAL LOW
Sodium: 135
Sodium: 138
Total Bilirubin: 0.8
Total Bilirubin: 0.9
Total Bilirubin: 1
Total Protein: 5.1 — ABNORMAL LOW

## 2011-09-04 LAB — CBC
HCT: 33 — ABNORMAL LOW
HCT: 37.8
Hemoglobin: 11.8 — ABNORMAL LOW
Hemoglobin: 11.9 — ABNORMAL LOW
MCHC: 34.9
MCHC: 35.2
MCHC: 35.3
MCHC: 35.3
MCV: 101 — ABNORMAL HIGH
MCV: 102.7 — ABNORMAL HIGH
MCV: 103.5 — ABNORMAL HIGH
Platelets: 135 — ABNORMAL LOW
Platelets: 148 — ABNORMAL LOW
Platelets: 148 — ABNORMAL LOW
Platelets: 179
Platelets: 208
RBC: 3.22 — ABNORMAL LOW
RBC: 3.25 — ABNORMAL LOW
RBC: 3.32 — ABNORMAL LOW
RBC: 3.35 — ABNORMAL LOW
RDW: 14.4 — ABNORMAL HIGH
RDW: 14.5 — ABNORMAL HIGH
RDW: 14.6 — ABNORMAL HIGH
RDW: 14.7 — ABNORMAL HIGH
WBC: 10.6 — ABNORMAL HIGH
WBC: 8.7
WBC: 9.2
WBC: 9.9

## 2011-09-04 LAB — URINALYSIS, ROUTINE W REFLEX MICROSCOPIC
Ketones, ur: NEGATIVE
Nitrite: NEGATIVE
Urobilinogen, UA: 0.2

## 2011-09-04 LAB — DIFFERENTIAL
Basophils Absolute: 0
Basophils Absolute: 0
Basophils Absolute: 0
Basophils Absolute: 0
Basophils Relative: 0
Basophils Relative: 0
Basophils Relative: 0
Eosinophils Absolute: 0.1
Eosinophils Absolute: 0.1
Eosinophils Absolute: 0.1
Eosinophils Absolute: 0.2
Eosinophils Relative: 1
Eosinophils Relative: 3
Lymphocytes Relative: 30
Lymphs Abs: 2.9
Lymphs Abs: 3.1
Monocytes Relative: 5
Neutro Abs: 6.7
Neutro Abs: 9.4 — ABNORMAL HIGH
Neutrophils Relative %: 59
Neutrophils Relative %: 63
Neutrophils Relative %: 64
Neutrophils Relative %: 67
Neutrophils Relative %: 70

## 2011-09-04 LAB — RPR: RPR Ser Ql: NONREACTIVE

## 2011-09-04 LAB — URIC ACID
Uric Acid, Serum: 7
Uric Acid, Serum: 7.9 — ABNORMAL HIGH
Uric Acid, Serum: 8 — ABNORMAL HIGH

## 2011-09-04 LAB — MAGNESIUM: Magnesium: 4.3 — ABNORMAL HIGH

## 2011-09-04 LAB — URINE MICROSCOPIC-ADD ON

## 2011-09-04 LAB — LACTATE DEHYDROGENASE
LDH: 169
LDH: 182
LDH: 210

## 2011-09-05 LAB — URINALYSIS, ROUTINE W REFLEX MICROSCOPIC
Ketones, ur: NEGATIVE
Leukocytes, UA: NEGATIVE
Nitrite: NEGATIVE
Protein, ur: NEGATIVE
pH: 6

## 2011-09-05 LAB — CBC
HCT: 31.9 — ABNORMAL LOW
HCT: 32.9 — ABNORMAL LOW
Hemoglobin: 11.9 — ABNORMAL LOW
Hemoglobin: 11.8 — ABNORMAL LOW
MCHC: 35.2
MCHC: 35.8
MCV: 101.3 — ABNORMAL HIGH
MCV: 99.7
Platelets: 156
Platelets: 161
Platelets: 166
Platelets: 190
RBC: 3.63 — ABNORMAL LOW
RBC: 3.3 — ABNORMAL LOW
RDW: 14.2 — ABNORMAL HIGH
RDW: 13.5
WBC: 11 — ABNORMAL HIGH
WBC: 9.9
WBC: 9.8

## 2011-09-05 LAB — COMPREHENSIVE METABOLIC PANEL
ALT: 10
ALT: 19
ALT: 15
AST: 17
AST: 21
Albumin: 2.3 — ABNORMAL LOW
Albumin: 2.3 — ABNORMAL LOW
Albumin: 2.5 — ABNORMAL LOW
Albumin: 2.6 — ABNORMAL LOW
Alkaline Phosphatase: 102
Alkaline Phosphatase: 111
Alkaline Phosphatase: 81
BUN: 13
BUN: 9
CO2: 20
CO2: 21
Calcium: 8.4
Calcium: 8.5
Chloride: 107
Chloride: 108
Chloride: 108
Creatinine, Ser: 0.59
Creatinine, Ser: 0.66
Creatinine, Ser: 0.66
GFR calc Af Amer: >60
GFR calc Af Amer: >60
GFR calc non Af Amer: >60
GFR calc non Af Amer: >60
Glucose, Bld: 105 — ABNORMAL HIGH
Potassium: 3.9
Potassium: 4.3
Potassium: 3.6
Sodium: 133 — ABNORMAL LOW
Sodium: 134 — ABNORMAL LOW
Sodium: 136
Total Bilirubin: 0.8
Total Bilirubin: 1
Total Bilirubin: 0.6
Total Protein: 5.4 — ABNORMAL LOW
Total Protein: 6.1

## 2011-09-05 LAB — URIC ACID
Uric Acid, Serum: 6.8
Uric Acid, Serum: 7.6 — ABNORMAL HIGH
Uric Acid, Serum: 5.8

## 2011-09-05 LAB — LACTATE DEHYDROGENASE: LDH: 155

## 2011-09-05 LAB — FETAL FIBRONECTIN: Fetal Fibronectin: POSITIVE

## 2011-09-05 LAB — URINE MICROSCOPIC-ADD ON

## 2012-02-13 ENCOUNTER — Ambulatory Visit (INDEPENDENT_AMBULATORY_CARE_PROVIDER_SITE_OTHER): Payer: Managed Care, Other (non HMO) | Admitting: Obstetrics and Gynecology

## 2012-02-13 DIAGNOSIS — Z01419 Encounter for gynecological examination (general) (routine) without abnormal findings: Secondary | ICD-10-CM

## 2012-02-13 DIAGNOSIS — Z124 Encounter for screening for malignant neoplasm of cervix: Secondary | ICD-10-CM

## 2012-09-06 DIAGNOSIS — E7522 Gaucher disease: Secondary | ICD-10-CM | POA: Insufficient documentation

## 2012-11-06 ENCOUNTER — Encounter: Payer: Self-pay | Admitting: Obstetrics and Gynecology

## 2012-11-06 ENCOUNTER — Ambulatory Visit (INDEPENDENT_AMBULATORY_CARE_PROVIDER_SITE_OTHER): Payer: Managed Care, Other (non HMO) | Admitting: Obstetrics and Gynecology

## 2012-11-06 VITALS — BP 116/70

## 2012-11-06 DIAGNOSIS — Z309 Encounter for contraceptive management, unspecified: Secondary | ICD-10-CM

## 2012-11-06 DIAGNOSIS — IMO0002 Reserved for concepts with insufficient information to code with codable children: Secondary | ICD-10-CM

## 2012-11-06 DIAGNOSIS — Z30433 Encounter for removal and reinsertion of intrauterine contraceptive device: Secondary | ICD-10-CM

## 2012-11-06 DIAGNOSIS — Z30430 Encounter for insertion of intrauterine contraceptive device: Secondary | ICD-10-CM

## 2012-11-06 MED ORDER — LEVONORGESTREL 20 MCG/24HR IU IUD
INTRAUTERINE_SYSTEM | Freq: Once | INTRAUTERINE | Status: AC
  Administered 2012-11-06: 1 via INTRAUTERINE

## 2012-11-06 NOTE — Progress Notes (Signed)
IUD INSERTION NOTE   Consent signed after risks and benefits were reviewed including but not limited to bleeding, infection and risk of uterine perforation.  LMP: No LMP recorded. Pt doesn't have cycle due to Mirena UPT: Negative GC / Chlamydia: done today Pt declines pain meds prior to procedure.   MIRENA SERIAL NUMBER: [id number]  Current Mirena easily removed Prepping with Betadine Tenaculum placed on anterior lip of cervix Uterus sounded at  8 cm Insertion of MIRENA IUD per protocol without any complications  POST-PROCEDURE:  Patient instructed to call with fever or excessive pain Patient instructed to check IUD strings after each menstrual cycle   Follow-up: 6 weeks   Silverio Lay MD 11/06/2012 10:38 AM

## 2012-11-07 LAB — GC/CHLAMYDIA PROBE AMP
CT Probe RNA: NEGATIVE
GC Probe RNA: NEGATIVE

## 2012-11-19 ENCOUNTER — Encounter: Payer: Managed Care, Other (non HMO) | Admitting: Obstetrics and Gynecology

## 2012-12-09 ENCOUNTER — Encounter: Payer: Managed Care, Other (non HMO) | Admitting: Obstetrics and Gynecology

## 2015-11-09 ENCOUNTER — Ambulatory Visit (HOSPITAL_COMMUNITY): Payer: Self-pay | Admitting: Psychiatry

## 2018-02-03 DIAGNOSIS — J209 Acute bronchitis, unspecified: Secondary | ICD-10-CM | POA: Diagnosis not present

## 2018-03-09 DIAGNOSIS — E7522 Gaucher disease: Secondary | ICD-10-CM | POA: Diagnosis not present

## 2018-03-09 DIAGNOSIS — R635 Abnormal weight gain: Secondary | ICD-10-CM | POA: Diagnosis not present

## 2018-11-02 DIAGNOSIS — Z79899 Other long term (current) drug therapy: Secondary | ICD-10-CM | POA: Diagnosis not present

## 2018-11-02 DIAGNOSIS — Z7183 Encounter for nonprocreative genetic counseling: Secondary | ICD-10-CM | POA: Diagnosis not present

## 2018-11-02 DIAGNOSIS — E7522 Gaucher disease: Secondary | ICD-10-CM | POA: Diagnosis not present

## 2018-11-02 DIAGNOSIS — R635 Abnormal weight gain: Secondary | ICD-10-CM | POA: Diagnosis not present

## 2018-11-09 DIAGNOSIS — Z30430 Encounter for insertion of intrauterine contraceptive device: Secondary | ICD-10-CM | POA: Diagnosis not present

## 2018-11-09 DIAGNOSIS — Z113 Encounter for screening for infections with a predominantly sexual mode of transmission: Secondary | ICD-10-CM | POA: Diagnosis not present

## 2018-11-09 DIAGNOSIS — Z30433 Encounter for removal and reinsertion of intrauterine contraceptive device: Secondary | ICD-10-CM | POA: Diagnosis not present

## 2018-12-10 DIAGNOSIS — Z01419 Encounter for gynecological examination (general) (routine) without abnormal findings: Secondary | ICD-10-CM | POA: Diagnosis not present

## 2018-12-10 DIAGNOSIS — Z6836 Body mass index (BMI) 36.0-36.9, adult: Secondary | ICD-10-CM | POA: Diagnosis not present

## 2018-12-10 DIAGNOSIS — Z1231 Encounter for screening mammogram for malignant neoplasm of breast: Secondary | ICD-10-CM | POA: Diagnosis not present

## 2018-12-10 DIAGNOSIS — Z30431 Encounter for routine checking of intrauterine contraceptive device: Secondary | ICD-10-CM | POA: Diagnosis not present

## 2019-01-06 DIAGNOSIS — J069 Acute upper respiratory infection, unspecified: Secondary | ICD-10-CM | POA: Diagnosis not present

## 2019-05-28 DIAGNOSIS — F411 Generalized anxiety disorder: Secondary | ICD-10-CM | POA: Diagnosis not present

## 2019-06-01 DIAGNOSIS — F411 Generalized anxiety disorder: Secondary | ICD-10-CM | POA: Diagnosis not present

## 2019-06-01 DIAGNOSIS — F410 Panic disorder [episodic paroxysmal anxiety] without agoraphobia: Secondary | ICD-10-CM | POA: Diagnosis not present

## 2019-06-11 DIAGNOSIS — F411 Generalized anxiety disorder: Secondary | ICD-10-CM | POA: Diagnosis not present

## 2019-06-16 ENCOUNTER — Encounter: Payer: Self-pay | Admitting: Family Medicine

## 2019-06-16 ENCOUNTER — Ambulatory Visit (INDEPENDENT_AMBULATORY_CARE_PROVIDER_SITE_OTHER): Payer: BC Managed Care – PPO | Admitting: Family Medicine

## 2019-06-16 ENCOUNTER — Other Ambulatory Visit: Payer: Self-pay

## 2019-06-16 VITALS — BP 118/80 | HR 81 | Temp 98.7°F | Resp 16 | Ht 67.0 in | Wt 245.8 lb

## 2019-06-16 DIAGNOSIS — F411 Generalized anxiety disorder: Secondary | ICD-10-CM

## 2019-06-16 DIAGNOSIS — E7522 Gaucher disease: Secondary | ICD-10-CM | POA: Diagnosis not present

## 2019-06-16 DIAGNOSIS — Z9081 Acquired absence of spleen: Secondary | ICD-10-CM | POA: Insufficient documentation

## 2019-06-16 DIAGNOSIS — Z975 Presence of (intrauterine) contraceptive device: Secondary | ICD-10-CM | POA: Diagnosis not present

## 2019-06-16 DIAGNOSIS — E669 Obesity, unspecified: Secondary | ICD-10-CM | POA: Insufficient documentation

## 2019-06-16 DIAGNOSIS — L409 Psoriasis, unspecified: Secondary | ICD-10-CM

## 2019-06-16 HISTORY — DX: Acquired absence of spleen: Z90.81

## 2019-06-16 MED ORDER — CLONAZEPAM 0.5 MG PO TABS: 0.2500 mg | ORAL_TABLET | Freq: Two times a day (BID) | ORAL | 1 refills | Status: DC | PRN

## 2019-06-16 MED ORDER — CLOBETASOL PROPIONATE 0.05 % EX LIQD: Freq: Two times a day (BID) | CUTANEOUS | 2 refills | Status: AC

## 2019-06-16 MED ORDER — CLOBETASOL PROPIONATE 0.05 % EX SHAM: MEDICATED_SHAMPOO | CUTANEOUS | 2 refills | Status: DC

## 2019-06-16 NOTE — Patient Instructions (Signed)
Please return in 6-8 weeks for recheck of mood.   Please call your specialist at [facility]; please ask her if she has recommendations regarding mood medications for anxiety, specifically SSRI, SNRI or other that would be safe to use with your [place].  I will continue to research as well.   Start the low dose klonopin as needed to see if it helps control your panic symptoms.   It was a pleasure meeting you today! Thank you for choosing [place] to meet your healthcare needs! I truly look forward to working with you. If you have any questions or concerns, please send me a message via Mychart or call the office at [phone].

## 2019-06-16 NOTE — Progress Notes (Signed)
Subjective  CC:  Chief Complaint  Patient presents with  . Establish Care    OB Dr. Cletis Media (has been acting PCP) and Dr. Sandi Carne at [facility]  . Anxiety    Has been going to a therapist whom recommended her to come here for medication management    HPI: Yesenia Nichols is a 43 y.o. female who presents to [facility] [place] today to establish care with me as a new patient.   She has the following concerns or needs:  43 yo with Gaucher disease managed by [facility] genetics, psoriasis, and obesity presents due to concerns of worsening stress/anxiety sxs. 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 Now, struggling; interfering with sleep, heart racing, some panic attacks and some low motivation. She has never been on medications for mood in the past. She is a busy, high functioning well organized person typically.   Depression screen PHQ 2/9 06/16/2019  Decreased Interest 1  Down, Depressed, Hopeless 1  PHQ - 2 Score 2  Altered sleeping 1  Tired, decreased energy 1  Change in appetite 2  Feeling bad or failure about yourself  1  Trouble concentrating 2  Moving slowly or fidgety/restless 2  Suicidal thoughts 0  PHQ-9 Score 11  Difficult doing work/chores Somewhat difficult   GAD 7 : Generalized Anxiety Score 06/16/2019  Nervous, Anxious, on Edge 3  Control/stop worrying 3  Worry too much - different things 2  Trouble relaxing 3  Restless 2  Easily annoyed or irritable 2  Afraid - awful might happen 1  Total GAD 7 Score 16  Anxiety Difficulty Somewhat difficult    Gaucher's: She was diagnosed at age 58.  She is had partial splenectomy.  She was on  IV biweekly treatments for many years until a new oral medication came out and she was in the drug trial.  She currently is on this medication.  She feels overall she does very well.  Does not have any major complications from her disease at this time.  She has close follow-up at [facility].  Psoriasis: Chronic, had seen dermatology for years.  Does need refills on some steroid sprays and shampoo.  Currently active in scalp and behind the ears.  Does have patches at the elbow.  Stress is been her most recent trigger.  Her daughter has the same  Obesity: Has struggled with weight loss.  GYN care by her gynecologist to 20 years.  Reports Pap smears are up-to-date.  Has Mirena IUD, this is her third.  She has 2 children   Assessment  1. GAD (generalized anxiety disorder)   2. IUD (intrauterine device) in place - Mirena   3. Gaucher disease, type I  - avoid NSAIDS   4. History of partial splenectomy   5. Psoriasis      Plan   Today's visit was 45 minutes long. Greater than 50% of this time was devoted to face to face counseling with the patient and coordination of care. We discussed her diagnosis, prognosis, treatment options and treatment plan is documented below.    GAD, new dx: counseling done. Continue with therapy. Need to find a medication that she can  take while on Cerdalga (multiple drug interactions). Most SSRI's are contraindicated. Possibly could use trintellix. Buspar. Will start with low dose klonopin as needed and ask for direction from her specialist.   Gaucher's disease per specialist  Psoriasis: Steroid medications refilled.  Follow-up with Derm as needed  IUD in place per gynecology.  Female wellness per gynecology  Follow up: 6 to 8 weeks to recheck mood No orders of the defined types were placed in this encounter.  Meds ordered this encounter  Medications  . Clobetasol Propionate (TEMOVATE) 0.05 % external spray    Sig: Apply topically 2 (two) times daily.    Dispense:   59 mL    Refill:  2  . Clobetasol Propionate 0.05 % shampoo    Sig: Use as directed    Dispense:  118 mL    Refill:  2  . clonazePAM (KLONOPIN) 0.5 MG tablet    Sig: Take 0.5-1 tablets (0.25-0.5 mg total) by mouth 2 (two) times daily as needed for anxiety.    Dispense:  20 tablet    Refill:  1     Depression screen PHQ 2/9 06/16/2019  Decreased Interest 1  Down, Depressed, Hopeless 1  PHQ - 2 Score 2  Altered sleeping 1  Tired, decreased energy 1  Change in appetite 2  Feeling bad or failure about yourself  1  Trouble concentrating 2  Moving slowly or fidgety/restless 2  Suicidal thoughts 0  PHQ-9 Score 11  Difficult doing work/chores Somewhat difficult    We updated and reviewed the patient's past history in detail and it is documented below.  Patient Active Problem List   Diagnosis Date Noted  . Obesity with body mass index 30 or greater 06/16/2019  . Psoriasis 06/16/2019  . IUD (intrauterine device) in place - Mirena 06/16/2019  . History of partial splenectomy 06/16/2019  . Gaucher disease, type I  - avoid NSAIDS 09/06/2012    Health Maintenance  Topic Date Due  . HIV Screening  02/18/1992  . INFLUENZA VACCINE  06/26/2019  . PAP SMEAR-Modifier  04/02/2020  . TETANUS/TDAP  06/15/2021   Immunization History  Administered Date(s) Administered  . Influenza,inj,Quad PF,6+ Mos 09/06/2013, 10/17/2014   Current Meds  Medication Sig  . Eliglustat Tartrate (CERDELGA) 84 MG CAPS Take 1 capsule by mouth 2 (two) times daily.  Marland Kitchen. levonorgestrel (MIRENA) 20 MCG/24HR IUD 1 each by Intrauterine route once.  . [DISCONTINUED] AMBULATORY NON FORMULARY MEDICATION Medication Name: Encore 100mg  daily.   Drug trial for Gauchers dz    Allergies: Patient is allergic to latex and sulfa antibiotics. Past Medical History Patient  has a past medical history of Gaucher disease (HCC) and History of partial splenectomy (06/16/2019). Past Surgical History Patient  has a past surgical  history that includes Splenectomy, partial (age 89); Dilation and curettage of uterus (2007); Wisdom tooth extraction (age 43); and Toe Surgery. Family History: Patient family history includes ADD / ADHD in her daughter and son; Arthritis in her mother; Asthma in her mother; Autism in her son; Breast cancer in her mother; Cancer in her paternal grandfather; Heart attack in her paternal grandmother; Heart disease in her maternal grandfather; High Cholesterol in her paternal grandfather and paternal grandmother; High blood pressure in her paternal grandfather and paternal grandmother; Hyperlipidemia in her father; Hypertension in her father; Prostate cancer in her father. Social History:  Patient  reports that she has never smoked. She has never used smokeless tobacco. She reports that she does not drink alcohol  or use drugs.  Review of Systems: Constitutional: negative for fever or malaise Ophthalmic: negative for photophobia, double vision or loss of vision Cardiovascular: negative for chest pain, dyspnea on exertion, or new LE swelling Respiratory: negative for SOB or persistent cough Gastrointestinal: negative for abdominal pain, change in bowel habits or melena Genitourinary: negative for dysuria or gross hematuria Musculoskeletal: negative for new gait disturbance or muscular weakness Integumentary: negative for new or persistent rashes Neurological: negative for TIA or stroke symptoms Psychiatric: negative for SI or delusions Allergic/Immunologic: negative for hives  Patient Care Team    Relationship Specialty Notifications Start End  Willow OraAndy, Ruthe Roemer L, MD PCP - General Family Medicine  06/16/19   Silverio Layivard, Sandra, MD Consulting Physician Obstetrics and Gynecology  06/16/19   Jacqlyn KraussHaverstock, Christina L, MD Referring Physician Dermatology  06/16/19   Cyndee BrightlyKishnani, Priya Physician Assistant Pediatrics  06/16/19     Objective  Vitals: BP 118/80   Pulse 81   Temp 98.7 F (37.1 C) (Oral)   Resp 16    Ht 5\' 7"  (1.702 m)   Wt 245 lb 12.8 oz (111.5 kg)   SpO2 97%   BMI 38.50 kg/m  General:  Well developed, well nourished, no acute distress  Psych:  Alert and oriented,normal mood and affect, good insight HEENT:  Normocephalic, atraumatic, non-icteric sclera, PERRL Skin:  Warm, no rashes or suspicious lesions noted Neurologic:    Mental status is normal. Gross motor and sensory exams are normal. Normal gait   Commons side effects, risks, benefits, and alternatives for medications and treatment plan prescribed today were discussed, and the patient expressed understanding of the given instructions. Patient is instructed to call or message via MyChart if he/she has any questions or concerns regarding our treatment plan. No barriers to understanding were identified. We discussed Red Flag symptoms and signs in detail. Patient expressed understanding regarding what to do in case of urgent or emergency type symptoms.   Medication list was reconciled, printed and provided to the patient in AVS. Patient instructions and summary information was reviewed with the patient as documented in the AVS. This note was prepared with assistance of Dragon voice recognition software. Occasional wrong-word or sound-a-like substitutions may have occurred due to the inherent limitations of voice recognition software

## 2019-06-18 ENCOUNTER — Other Ambulatory Visit: Payer: Self-pay | Admitting: *Deleted

## 2019-06-18 DIAGNOSIS — F411 Generalized anxiety disorder: Secondary | ICD-10-CM | POA: Diagnosis not present

## 2019-06-18 MED ORDER — CLOBETASOL PROPIONATE 0.05 % EX SHAM: MEDICATED_SHAMPOO | CUTANEOUS | 2 refills | Status: AC

## 2019-06-25 DIAGNOSIS — F411 Generalized anxiety disorder: Secondary | ICD-10-CM | POA: Diagnosis not present

## 2019-07-02 ENCOUNTER — Encounter: Payer: Self-pay | Admitting: Family Medicine

## 2019-07-07 MED ORDER — VORTIOXETINE HBR 5 MG PO TABS: 5.0000 mg | ORAL_TABLET | Freq: Every day | ORAL | 5 refills | Status: DC

## 2019-07-09 DIAGNOSIS — F411 Generalized anxiety disorder: Secondary | ICD-10-CM | POA: Diagnosis not present

## 2019-07-26 DIAGNOSIS — E7522 Gaucher disease: Secondary | ICD-10-CM | POA: Diagnosis not present

## 2019-07-28 ENCOUNTER — Encounter: Payer: Self-pay | Admitting: Family Medicine

## 2019-07-29 ENCOUNTER — Ambulatory Visit: Payer: BC Managed Care – PPO | Admitting: Family Medicine

## 2019-07-30 DIAGNOSIS — F411 Generalized anxiety disorder: Secondary | ICD-10-CM | POA: Diagnosis not present

## 2019-08-11 DIAGNOSIS — F411 Generalized anxiety disorder: Secondary | ICD-10-CM | POA: Diagnosis not present

## 2019-08-12 ENCOUNTER — Ambulatory Visit: Payer: BC Managed Care – PPO | Admitting: Family Medicine

## 2019-08-12 ENCOUNTER — Encounter: Payer: Self-pay | Admitting: Family Medicine

## 2019-08-20 ENCOUNTER — Ambulatory Visit: Payer: BC Managed Care – PPO | Admitting: Family Medicine

## 2019-08-20 DIAGNOSIS — F411 Generalized anxiety disorder: Secondary | ICD-10-CM | POA: Diagnosis not present

## 2019-08-23 ENCOUNTER — Encounter: Payer: Self-pay | Admitting: Family Medicine

## 2019-08-24 ENCOUNTER — Ambulatory Visit: Payer: BC Managed Care – PPO | Admitting: Family Medicine

## 2019-08-25 ENCOUNTER — Encounter: Payer: Self-pay | Admitting: Family Medicine

## 2019-09-03 DIAGNOSIS — F411 Generalized anxiety disorder: Secondary | ICD-10-CM | POA: Diagnosis not present

## 2019-09-09 DIAGNOSIS — F411 Generalized anxiety disorder: Secondary | ICD-10-CM | POA: Diagnosis not present

## 2019-09-24 DIAGNOSIS — F411 Generalized anxiety disorder: Secondary | ICD-10-CM | POA: Diagnosis not present

## 2019-10-01 DIAGNOSIS — F411 Generalized anxiety disorder: Secondary | ICD-10-CM | POA: Diagnosis not present

## 2019-10-13 ENCOUNTER — Encounter: Payer: Self-pay | Admitting: Family Medicine

## 2019-10-13 MED ORDER — VORTIOXETINE HBR 5 MG PO TABS: 5.0000 mg | ORAL_TABLET | Freq: Every day | ORAL | 0 refills | Status: DC

## 2019-10-27 DIAGNOSIS — F411 Generalized anxiety disorder: Secondary | ICD-10-CM | POA: Diagnosis not present

## 2019-12-14 DIAGNOSIS — F411 Generalized anxiety disorder: Secondary | ICD-10-CM | POA: Diagnosis not present

## 2019-12-23 DIAGNOSIS — F411 Generalized anxiety disorder: Secondary | ICD-10-CM | POA: Diagnosis not present

## 2020-01-10 ENCOUNTER — Other Ambulatory Visit: Payer: Self-pay | Admitting: Family Medicine

## 2020-01-10 NOTE — Telephone Encounter (Signed)
Pt is overdue for a follow up appointment.  Please call to schedule. Virtual visit is fine: f/uGAD andmeds.  I have refilled meds for 3 months only. Thanks.

## 2020-01-10 NOTE — Telephone Encounter (Signed)
Called and LVM to schedule appt

## 2020-01-10 NOTE — Telephone Encounter (Signed)
LAST APPOINTMENT DATE: 06/16/2019  NEXT APPOINTMENT DATE:@Visit  date not found   LAST REFILL: 10/13/2019  QTY: 90 tablet

## 2020-01-12 DIAGNOSIS — F411 Generalized anxiety disorder: Secondary | ICD-10-CM | POA: Diagnosis not present

## 2020-02-10 DIAGNOSIS — F411 Generalized anxiety disorder: Secondary | ICD-10-CM | POA: Diagnosis not present

## 2020-04-08 ENCOUNTER — Other Ambulatory Visit: Payer: Self-pay | Admitting: Family Medicine

## 2020-04-26 DIAGNOSIS — F411 Generalized anxiety disorder: Secondary | ICD-10-CM | POA: Diagnosis not present

## 2020-05-15 DIAGNOSIS — Z6838 Body mass index (BMI) 38.0-38.9, adult: Secondary | ICD-10-CM | POA: Diagnosis not present

## 2020-05-15 DIAGNOSIS — Z01419 Encounter for gynecological examination (general) (routine) without abnormal findings: Secondary | ICD-10-CM | POA: Diagnosis not present

## 2020-05-15 DIAGNOSIS — Z1231 Encounter for screening mammogram for malignant neoplasm of breast: Secondary | ICD-10-CM | POA: Diagnosis not present

## 2020-05-15 DIAGNOSIS — Z124 Encounter for screening for malignant neoplasm of cervix: Secondary | ICD-10-CM | POA: Diagnosis not present

## 2020-05-19 ENCOUNTER — Other Ambulatory Visit: Payer: Self-pay | Admitting: Family Medicine

## 2020-05-24 DIAGNOSIS — M79672 Pain in left foot: Secondary | ICD-10-CM | POA: Diagnosis not present

## 2020-05-24 DIAGNOSIS — M79671 Pain in right foot: Secondary | ICD-10-CM | POA: Diagnosis not present

## 2020-06-21 ENCOUNTER — Other Ambulatory Visit: Payer: Self-pay | Admitting: Family Medicine

## 2020-06-21 DIAGNOSIS — M79671 Pain in right foot: Secondary | ICD-10-CM | POA: Diagnosis not present

## 2020-07-10 DIAGNOSIS — S9032XD Contusion of left foot, subsequent encounter: Secondary | ICD-10-CM | POA: Diagnosis not present

## 2020-07-10 DIAGNOSIS — S9031XD Contusion of right foot, subsequent encounter: Secondary | ICD-10-CM | POA: Diagnosis not present

## 2020-07-13 DIAGNOSIS — S9031XD Contusion of right foot, subsequent encounter: Secondary | ICD-10-CM | POA: Diagnosis not present

## 2020-07-13 DIAGNOSIS — S9032XD Contusion of left foot, subsequent encounter: Secondary | ICD-10-CM | POA: Diagnosis not present

## 2020-07-17 ENCOUNTER — Encounter: Payer: Self-pay | Admitting: Family Medicine

## 2020-07-18 ENCOUNTER — Other Ambulatory Visit: Payer: Self-pay

## 2020-07-18 DIAGNOSIS — S9031XD Contusion of right foot, subsequent encounter: Secondary | ICD-10-CM | POA: Diagnosis not present

## 2020-07-18 DIAGNOSIS — S9032XD Contusion of left foot, subsequent encounter: Secondary | ICD-10-CM | POA: Diagnosis not present

## 2020-07-18 MED ORDER — VORTIOXETINE HBR 5 MG PO TABS: ORAL_TABLET | ORAL | 0 refills | Status: DC

## 2020-08-08 DIAGNOSIS — S9031XD Contusion of right foot, subsequent encounter: Secondary | ICD-10-CM | POA: Diagnosis not present

## 2020-08-08 DIAGNOSIS — S9032XD Contusion of left foot, subsequent encounter: Secondary | ICD-10-CM | POA: Diagnosis not present

## 2020-08-11 DIAGNOSIS — S9031XD Contusion of right foot, subsequent encounter: Secondary | ICD-10-CM | POA: Diagnosis not present

## 2020-08-11 DIAGNOSIS — S9032XD Contusion of left foot, subsequent encounter: Secondary | ICD-10-CM | POA: Diagnosis not present

## 2020-08-15 DIAGNOSIS — S9031XD Contusion of right foot, subsequent encounter: Secondary | ICD-10-CM | POA: Diagnosis not present

## 2020-08-15 DIAGNOSIS — S9032XD Contusion of left foot, subsequent encounter: Secondary | ICD-10-CM | POA: Diagnosis not present

## 2020-08-16 DIAGNOSIS — L409 Psoriasis, unspecified: Secondary | ICD-10-CM | POA: Diagnosis not present

## 2020-08-16 DIAGNOSIS — L723 Sebaceous cyst: Secondary | ICD-10-CM | POA: Diagnosis not present

## 2020-08-16 DIAGNOSIS — D225 Melanocytic nevi of trunk: Secondary | ICD-10-CM | POA: Diagnosis not present

## 2020-08-16 DIAGNOSIS — D2361 Other benign neoplasm of skin of right upper limb, including shoulder: Secondary | ICD-10-CM | POA: Diagnosis not present

## 2020-08-17 DIAGNOSIS — S9031XD Contusion of right foot, subsequent encounter: Secondary | ICD-10-CM | POA: Diagnosis not present

## 2020-08-17 DIAGNOSIS — S9032XD Contusion of left foot, subsequent encounter: Secondary | ICD-10-CM | POA: Diagnosis not present

## 2020-08-29 DIAGNOSIS — S9031XD Contusion of right foot, subsequent encounter: Secondary | ICD-10-CM | POA: Diagnosis not present

## 2020-08-29 DIAGNOSIS — S9032XD Contusion of left foot, subsequent encounter: Secondary | ICD-10-CM | POA: Diagnosis not present

## 2020-08-31 DIAGNOSIS — S9032XD Contusion of left foot, subsequent encounter: Secondary | ICD-10-CM | POA: Diagnosis not present

## 2020-08-31 DIAGNOSIS — S9031XD Contusion of right foot, subsequent encounter: Secondary | ICD-10-CM | POA: Diagnosis not present

## 2020-09-05 DIAGNOSIS — S9031XD Contusion of right foot, subsequent encounter: Secondary | ICD-10-CM | POA: Diagnosis not present

## 2020-09-05 DIAGNOSIS — S9032XD Contusion of left foot, subsequent encounter: Secondary | ICD-10-CM | POA: Diagnosis not present

## 2020-09-15 DIAGNOSIS — S9032XD Contusion of left foot, subsequent encounter: Secondary | ICD-10-CM | POA: Diagnosis not present

## 2020-09-15 DIAGNOSIS — S9031XD Contusion of right foot, subsequent encounter: Secondary | ICD-10-CM | POA: Diagnosis not present

## 2020-09-28 DIAGNOSIS — S9031XD Contusion of right foot, subsequent encounter: Secondary | ICD-10-CM | POA: Diagnosis not present

## 2020-09-28 DIAGNOSIS — S9032XD Contusion of left foot, subsequent encounter: Secondary | ICD-10-CM | POA: Diagnosis not present

## 2020-10-03 DIAGNOSIS — S9031XD Contusion of right foot, subsequent encounter: Secondary | ICD-10-CM | POA: Diagnosis not present

## 2020-10-03 DIAGNOSIS — S9032XD Contusion of left foot, subsequent encounter: Secondary | ICD-10-CM | POA: Diagnosis not present

## 2020-10-05 DIAGNOSIS — S9031XD Contusion of right foot, subsequent encounter: Secondary | ICD-10-CM | POA: Diagnosis not present

## 2020-10-05 DIAGNOSIS — S9032XD Contusion of left foot, subsequent encounter: Secondary | ICD-10-CM | POA: Diagnosis not present

## 2020-10-13 DIAGNOSIS — S9031XD Contusion of right foot, subsequent encounter: Secondary | ICD-10-CM | POA: Diagnosis not present

## 2020-10-13 DIAGNOSIS — S9032XD Contusion of left foot, subsequent encounter: Secondary | ICD-10-CM | POA: Diagnosis not present

## 2020-10-14 ENCOUNTER — Other Ambulatory Visit: Payer: Self-pay | Admitting: Family Medicine

## 2020-10-27 DIAGNOSIS — S9031XD Contusion of right foot, subsequent encounter: Secondary | ICD-10-CM | POA: Diagnosis not present

## 2020-10-27 DIAGNOSIS — S9032XD Contusion of left foot, subsequent encounter: Secondary | ICD-10-CM | POA: Diagnosis not present

## 2020-12-01 ENCOUNTER — Encounter: Payer: Self-pay | Admitting: Family Medicine

## 2020-12-01 NOTE — Telephone Encounter (Signed)
Please document covid vaccinations. See mychart note and pic

## 2020-12-07 DIAGNOSIS — F411 Generalized anxiety disorder: Secondary | ICD-10-CM | POA: Diagnosis not present

## 2020-12-29 DIAGNOSIS — F411 Generalized anxiety disorder: Secondary | ICD-10-CM | POA: Diagnosis not present

## 2021-02-02 ENCOUNTER — Ambulatory Visit: Payer: BC Managed Care – PPO | Admitting: Family Medicine

## 2021-02-21 ENCOUNTER — Other Ambulatory Visit: Payer: Self-pay

## 2021-02-21 ENCOUNTER — Encounter: Payer: Self-pay | Admitting: Family Medicine

## 2021-02-21 ENCOUNTER — Ambulatory Visit: Payer: BC Managed Care – PPO | Admitting: Family Medicine

## 2021-02-21 VITALS — BP 120/80 | HR 82 | Temp 97.2°F | Resp 16 | Ht 67.0 in | Wt 262.8 lb

## 2021-02-21 DIAGNOSIS — F411 Generalized anxiety disorder: Secondary | ICD-10-CM

## 2021-02-21 DIAGNOSIS — E7522 Gaucher disease: Secondary | ICD-10-CM

## 2021-02-21 MED ORDER — VORTIOXETINE HBR 5 MG PO TABS: ORAL_TABLET | ORAL | 3 refills | Status: AC

## 2021-02-21 MED ORDER — CLONAZEPAM 0.5 MG PO TABS: 0.2500 mg | ORAL_TABLET | Freq: Two times a day (BID) | ORAL | 1 refills | Status: DC | PRN

## 2021-02-21 NOTE — Progress Notes (Signed)
Subjective  CC:  Chief Complaint  Patient presents with  . Anxiety    Has been taking Trintellix off and on over the past 2 years.   . Obesity    Has gained 16 lbs since last visit in 2020    HPI: Yesenia Nichols is a 45 y.o. female who presents to the office today to address the problems listed above in the chief complaint, mood problems.  45 year old female with history of anxiety disorder.  See last visit from 2020.  She did very well on Trintellix and very rare use of Klonopin for panic symptoms.  She ran out about 3 months ago.  Unfortunately, her anxiety symptoms have returned.  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  Would like to restart her medications.  She is staying in a therapist regularly.  Mild depressive symptoms including decreased motivation for exercise.  Some loss of concentration.  She more endorses anxiety symptoms.  No suicidal ideation.  Obesity: During the pandemic she has been less active and has increased her alcohol intake.  She recently has changes.  She has gained some weight and is going to work on this.  She has not been able to exercise recently in part due to low motivation.  She likes more savory foods over the sweets.  She has a good sense of healthy versus unhealthy diets.  Health maintenance: Due for complete physical.  She does see GYN for female wellness care.   Depression screen [facility] 2/9 02/21/2021 06/16/2019  Decreased Interest 2 1  Down, Depressed, Hopeless 0 1  PHQ - 2 Score 2 2  Altered sleeping 0 1  Tired, decreased energy 2 1  Change in appetite 0 2  Feeling bad or failure about yourself  0 1  Trouble concentrating 3 2  Moving slowly or fidgety/restless 0 2  Suicidal thoughts 0 0  PHQ-9 Score 7 11  Difficult doing work/chores - Somewhat difficult   GAD 7 : Generalized Anxiety Score 02/21/2021 06/16/2019  Nervous, Anxious, on Edge 3 3  Control/stop  worrying 2 3  Worry too much - different things 2 2  Trouble relaxing 0 3  Restless 0 2  Easily annoyed or irritable 1 2  Afraid - awful might happen 0 1  Total GAD 7 Score 8 16  Anxiety Difficulty - Somewhat difficult     Assessment  1. GAD (generalized anxiety disorder)   2. Gaucher disease, type I (HCC) Chronic  3. Morbid obesity (HCC)      Plan   General anxiety disorder: Very good response to Trintellix.  His medication is required due to her multiple drug interactions with Cerdalgia.  Reordered.  Klonopin to use only if needed for panic.  Follow-up 3 months  Gaucher disease: Continue to be stable.  Follows up with [facility].  High risk medication.  Morbid obesity: Counseling done.  Discussed healthy diet changes, strength training, increase in exercise.   Follow up: 3 months for complete physical No orders of the defined types were placed in this encounter.  Meds ordered this encounter  Medications  . vortioxetine HBr (TRINTELLIX) 5 MG TABS tablet    Sig: TAKE 1 TABLET(5 MG) BY MOUTH DAILY    Dispense:  90 tablet    Refill:  3  . clonazePAM (KLONOPIN) 0.5 MG tablet    Sig: Take 0.5-1 tablets (0.25-0.5 mg total) by mouth 2 (two) times  daily as needed for anxiety.    Dispense:  20 tablet    Refill:  1      I reviewed the patients updated PMH, FH, and SocHx.    Patient Active Problem List   Diagnosis Date Noted  . GAD (generalized anxiety disorder) 02/21/2021  . Morbid obesity (HCC) 02/21/2021  . Psoriasis 06/16/2019  . IUD (intrauterine device) in place - Mirena 06/16/2019  . History of partial splenectomy 06/16/2019  . Gaucher disease, type I  - avoid NSAIDS 09/06/2012   Current Meds  Medication Sig  . Clobetasol Propionate (TEMOVATE) 0.05 % external spray Apply topically 2 (two) times daily.  . Clobetasol Propionate 0.05 % shampoo Use once daily as directed  . Eliglustat Tartrate (CERDELGA) 84 MG CAPS Take 1 capsule by mouth 2 (two) times daily.  Marland Kitchen  levonorgestrel (MIRENA) 20 MCG/24HR IUD 1 each by Intrauterine route once.  . Multiple Vitamin (MULTIVITAMIN PO) Take by mouth.    Allergies: Patient is allergic to latex and sulfa antibiotics. Family history:  Patient family history includes ADD / ADHD in her daughter and son; Arthritis in her mother; Asthma in her mother; Autism in her son; Breast cancer in her mother; Cancer in her paternal grandfather; Heart attack in her paternal grandmother; Heart disease in her maternal grandfather; High Cholesterol in her paternal grandfather and paternal grandmother; High blood pressure in her paternal grandfather and paternal grandmother; Hyperlipidemia in her father; Hypertension in her father; Prostate cancer in her father. Social History   Socioeconomic History  . Marital status: Married    Spouse name: Not on file  . Number of children: 1  . Years of education: Not on file  . Highest education level: Not on file  Occupational History  . Occupation: Psychologist, sport and exercise, Marketing executive  Tobacco Use  . Smoking status: Never Smoker  . Smokeless tobacco: Never Used  Substance and Sexual Activity  . Alcohol use: No  . Drug use: No  . Sexual activity: Yes    Birth control/protection: I.U.D.  Other Topics Concern  . Not on file  Social History Narrative  . Not on file   Social Determinants of Health   Financial Resource Strain: Not on file  Food Insecurity: Not on file  Transportation Needs: Not on file  Physical Activity: Not on file  Stress: Not on file  Social Connections: Not on file     Review of Systems: Constitutional: Negative for fever malaise or anorexia Cardiovascular: negative for chest pain Respiratory: negative for SOB or persistent cough Gastrointestinal: negative for abdominal pain  Objective  Vitals: BP 120/80   Pulse 82   Temp (!) 97.2 F (36.2 C) (Temporal)   Resp 16   Ht 5\' 7"  (1.702 m)   Wt 262 lb 12.8 oz (119.2 kg)   SpO2 98%   BMI 41.16 kg/m   General: no acute distress, well appearing, no apparent distress, well groomed Psych:  Alert and oriented x 3,normal mood, behavior, speech, dress, and thought processes.      Commons side effects, risks, benefits, and alternatives for medications and treatment plan prescribed today were discussed, and the patient expressed understanding of the given instructions. Patient is instructed to call or message via MyChart if he/she has any questions or concerns regarding our treatment plan. No barriers to understanding were identified. We discussed Red Flag symptoms and signs in detail. Patient expressed understanding regarding what to do in case of urgent or emergency type symptoms.   Medication list was  reconciled, printed and provided to the patient in AVS. Patient instructions and summary information was reviewed with the patient as documented in the AVS. This note was prepared with assistance of Dragon voice recognition software. Occasional wrong-word or sound-a-like substitutions may have occurred due to the inherent limitations of voice recognition software

## 2021-02-21 NOTE — Patient Instructions (Signed)
Please return in 3 months for your annual complete physical; please come fasting.  It was good seeing you again!  If you have any questions or concerns, please don't hesitate to send me a message via MyChart or call the office at [phone]. Thank you for visiting with [place] today! It's our pleasure caring for you.

## 2021-03-02 ENCOUNTER — Telehealth: Payer: Self-pay

## 2021-03-02 NOTE — Telephone Encounter (Signed)
vortioxetine HBr (TRINTELLIX) 5 MG TABS tablet   Pt states pharmacy sent a pre authorization for medication above. Pt is checking the status

## 2021-03-05 ENCOUNTER — Telehealth: Payer: Self-pay

## 2021-03-05 NOTE — Telephone Encounter (Signed)
PA approved. Phone note in chart

## 2021-03-05 NOTE — Telephone Encounter (Signed)
Pt is following up on this. Pt states pharmacy has been sending faxes to [place] for a week.

## 2021-03-05 NOTE — Telephone Encounter (Signed)
Trintellix 5 mg approved   02/02/21-03/05/22   Case # [id number]

## 2021-04-18 DIAGNOSIS — F411 Generalized anxiety disorder: Secondary | ICD-10-CM | POA: Diagnosis not present

## 2021-06-01 ENCOUNTER — Encounter: Payer: BC Managed Care – PPO | Admitting: Family Medicine

## 2021-07-31 ENCOUNTER — Other Ambulatory Visit: Payer: Self-pay | Admitting: Family Medicine

## 2021-08-01 NOTE — Telephone Encounter (Signed)
Last Refill 06/26/2021 Last OV 02/21/2021 dx Anxiety

## 2021-08-02 NOTE — Telephone Encounter (Signed)
Please call patient. She is overdue for f/u and a complete physical. I refilled klonopin last month; will refill now but no further refills w/o ov. I recommend a cpe visit - see if can get scheduled. If anxiety is much worse, then will need a f/u ov for anxiety before I can refill any more klonopin. Thanks.

## 2021-08-02 NOTE — Telephone Encounter (Signed)
Called patient, unable to leave a voicemail regarding further refills and being scheduled for an o/v.

## 2021-10-04 DIAGNOSIS — E7522 Gaucher disease: Secondary | ICD-10-CM | POA: Diagnosis not present

## 2021-10-04 DIAGNOSIS — Z79899 Other long term (current) drug therapy: Secondary | ICD-10-CM | POA: Diagnosis not present

## 2021-10-04 DIAGNOSIS — Z7183 Encounter for nonprocreative genetic counseling: Secondary | ICD-10-CM | POA: Diagnosis not present

## 2021-10-04 DIAGNOSIS — L409 Psoriasis, unspecified: Secondary | ICD-10-CM | POA: Diagnosis not present

## 2021-10-04 DIAGNOSIS — Z8616 Personal history of COVID-19: Secondary | ICD-10-CM | POA: Diagnosis not present

## 2022-06-05 DIAGNOSIS — Z1211 Encounter for screening for malignant neoplasm of colon: Secondary | ICD-10-CM | POA: Diagnosis not present

## 2022-06-05 DIAGNOSIS — Z1231 Encounter for screening mammogram for malignant neoplasm of breast: Secondary | ICD-10-CM | POA: Diagnosis not present

## 2022-06-05 DIAGNOSIS — Z01419 Encounter for gynecological examination (general) (routine) without abnormal findings: Secondary | ICD-10-CM | POA: Diagnosis not present

## 2022-06-12 DIAGNOSIS — L409 Psoriasis, unspecified: Secondary | ICD-10-CM | POA: Diagnosis not present

## 2022-06-12 DIAGNOSIS — L601 Onycholysis: Secondary | ICD-10-CM | POA: Diagnosis not present

## 2022-06-12 DIAGNOSIS — L710 Perioral dermatitis: Secondary | ICD-10-CM | POA: Diagnosis not present

## 2022-06-12 DIAGNOSIS — D1721 Benign lipomatous neoplasm of skin and subcutaneous tissue of right arm: Secondary | ICD-10-CM | POA: Diagnosis not present

## 2022-06-28 DIAGNOSIS — S39012A Strain of muscle, fascia and tendon of lower back, initial encounter: Secondary | ICD-10-CM | POA: Diagnosis not present

## 2022-07-05 DIAGNOSIS — M5416 Radiculopathy, lumbar region: Secondary | ICD-10-CM | POA: Diagnosis not present

## 2022-07-10 DIAGNOSIS — M5416 Radiculopathy, lumbar region: Secondary | ICD-10-CM | POA: Diagnosis not present

## 2022-07-12 DIAGNOSIS — M5416 Radiculopathy, lumbar region: Secondary | ICD-10-CM | POA: Diagnosis not present

## 2022-07-15 DIAGNOSIS — M5416 Radiculopathy, lumbar region: Secondary | ICD-10-CM | POA: Diagnosis not present

## 2022-07-17 DIAGNOSIS — M5416 Radiculopathy, lumbar region: Secondary | ICD-10-CM | POA: Diagnosis not present

## 2022-07-26 DIAGNOSIS — M5416 Radiculopathy, lumbar region: Secondary | ICD-10-CM | POA: Diagnosis not present

## 2022-08-09 DIAGNOSIS — M5416 Radiculopathy, lumbar region: Secondary | ICD-10-CM | POA: Diagnosis not present

## 2022-08-19 ENCOUNTER — Encounter: Payer: Self-pay | Admitting: *Deleted

## 2022-10-01 DIAGNOSIS — Z79899 Other long term (current) drug therapy: Secondary | ICD-10-CM | POA: Diagnosis not present

## 2022-10-01 DIAGNOSIS — F411 Generalized anxiety disorder: Secondary | ICD-10-CM | POA: Diagnosis not present

## 2022-10-01 DIAGNOSIS — F410 Panic disorder [episodic paroxysmal anxiety] without agoraphobia: Secondary | ICD-10-CM | POA: Diagnosis not present

## 2022-10-01 DIAGNOSIS — F332 Major depressive disorder, recurrent severe without psychotic features: Secondary | ICD-10-CM | POA: Diagnosis not present

## 2022-10-11 DIAGNOSIS — F411 Generalized anxiety disorder: Secondary | ICD-10-CM | POA: Diagnosis not present

## 2022-10-11 DIAGNOSIS — F332 Major depressive disorder, recurrent severe without psychotic features: Secondary | ICD-10-CM | POA: Diagnosis not present

## 2022-10-11 DIAGNOSIS — F410 Panic disorder [episodic paroxysmal anxiety] without agoraphobia: Secondary | ICD-10-CM | POA: Diagnosis not present

## 2022-10-29 DIAGNOSIS — F410 Panic disorder [episodic paroxysmal anxiety] without agoraphobia: Secondary | ICD-10-CM | POA: Diagnosis not present

## 2022-10-29 DIAGNOSIS — F411 Generalized anxiety disorder: Secondary | ICD-10-CM | POA: Diagnosis not present

## 2022-10-29 DIAGNOSIS — F332 Major depressive disorder, recurrent severe without psychotic features: Secondary | ICD-10-CM | POA: Diagnosis not present

## 2022-11-07 ENCOUNTER — Encounter: Payer: Self-pay | Admitting: *Deleted

## 2022-11-07 DIAGNOSIS — Z79899 Other long term (current) drug therapy: Secondary | ICD-10-CM | POA: Diagnosis not present

## 2022-11-07 DIAGNOSIS — E7522 Gaucher disease: Secondary | ICD-10-CM | POA: Diagnosis not present

## 2022-11-08 DIAGNOSIS — F410 Panic disorder [episodic paroxysmal anxiety] without agoraphobia: Secondary | ICD-10-CM | POA: Diagnosis not present

## 2022-11-08 DIAGNOSIS — F411 Generalized anxiety disorder: Secondary | ICD-10-CM | POA: Diagnosis not present

## 2022-11-08 DIAGNOSIS — F332 Major depressive disorder, recurrent severe without psychotic features: Secondary | ICD-10-CM | POA: Diagnosis not present

## 2022-11-21 DIAGNOSIS — F411 Generalized anxiety disorder: Secondary | ICD-10-CM | POA: Diagnosis not present

## 2022-11-21 DIAGNOSIS — F332 Major depressive disorder, recurrent severe without psychotic features: Secondary | ICD-10-CM | POA: Diagnosis not present

## 2022-11-21 DIAGNOSIS — F410 Panic disorder [episodic paroxysmal anxiety] without agoraphobia: Secondary | ICD-10-CM | POA: Diagnosis not present

## 2022-11-26 DIAGNOSIS — F410 Panic disorder [episodic paroxysmal anxiety] without agoraphobia: Secondary | ICD-10-CM | POA: Diagnosis not present

## 2022-11-26 DIAGNOSIS — F332 Major depressive disorder, recurrent severe without psychotic features: Secondary | ICD-10-CM | POA: Diagnosis not present

## 2022-11-26 DIAGNOSIS — F411 Generalized anxiety disorder: Secondary | ICD-10-CM | POA: Diagnosis not present

## 2022-12-06 DIAGNOSIS — F410 Panic disorder [episodic paroxysmal anxiety] without agoraphobia: Secondary | ICD-10-CM | POA: Diagnosis not present

## 2022-12-06 DIAGNOSIS — F332 Major depressive disorder, recurrent severe without psychotic features: Secondary | ICD-10-CM | POA: Diagnosis not present

## 2022-12-06 DIAGNOSIS — F411 Generalized anxiety disorder: Secondary | ICD-10-CM | POA: Diagnosis not present

## 2022-12-13 DIAGNOSIS — F332 Major depressive disorder, recurrent severe without psychotic features: Secondary | ICD-10-CM | POA: Diagnosis not present

## 2022-12-13 DIAGNOSIS — F410 Panic disorder [episodic paroxysmal anxiety] without agoraphobia: Secondary | ICD-10-CM | POA: Diagnosis not present

## 2022-12-13 DIAGNOSIS — F411 Generalized anxiety disorder: Secondary | ICD-10-CM | POA: Diagnosis not present

## 2022-12-20 DIAGNOSIS — F410 Panic disorder [episodic paroxysmal anxiety] without agoraphobia: Secondary | ICD-10-CM | POA: Diagnosis not present

## 2022-12-20 DIAGNOSIS — F332 Major depressive disorder, recurrent severe without psychotic features: Secondary | ICD-10-CM | POA: Diagnosis not present

## 2022-12-20 DIAGNOSIS — F411 Generalized anxiety disorder: Secondary | ICD-10-CM | POA: Diagnosis not present

## 2022-12-24 DIAGNOSIS — F332 Major depressive disorder, recurrent severe without psychotic features: Secondary | ICD-10-CM | POA: Diagnosis not present

## 2022-12-24 DIAGNOSIS — F410 Panic disorder [episodic paroxysmal anxiety] without agoraphobia: Secondary | ICD-10-CM | POA: Diagnosis not present

## 2022-12-24 DIAGNOSIS — F909 Attention-deficit hyperactivity disorder, unspecified type: Secondary | ICD-10-CM | POA: Diagnosis not present

## 2022-12-24 DIAGNOSIS — F411 Generalized anxiety disorder: Secondary | ICD-10-CM | POA: Diagnosis not present

## 2023-01-03 DIAGNOSIS — F332 Major depressive disorder, recurrent severe without psychotic features: Secondary | ICD-10-CM | POA: Diagnosis not present

## 2023-01-03 DIAGNOSIS — F410 Panic disorder [episodic paroxysmal anxiety] without agoraphobia: Secondary | ICD-10-CM | POA: Diagnosis not present

## 2023-01-03 DIAGNOSIS — F411 Generalized anxiety disorder: Secondary | ICD-10-CM | POA: Diagnosis not present

## 2023-01-17 DIAGNOSIS — F332 Major depressive disorder, recurrent severe without psychotic features: Secondary | ICD-10-CM | POA: Diagnosis not present

## 2023-01-17 DIAGNOSIS — F410 Panic disorder [episodic paroxysmal anxiety] without agoraphobia: Secondary | ICD-10-CM | POA: Diagnosis not present

## 2023-01-17 DIAGNOSIS — F411 Generalized anxiety disorder: Secondary | ICD-10-CM | POA: Diagnosis not present

## 2023-01-31 DIAGNOSIS — F411 Generalized anxiety disorder: Secondary | ICD-10-CM | POA: Diagnosis not present

## 2023-01-31 DIAGNOSIS — F410 Panic disorder [episodic paroxysmal anxiety] without agoraphobia: Secondary | ICD-10-CM | POA: Diagnosis not present

## 2023-01-31 DIAGNOSIS — F332 Major depressive disorder, recurrent severe without psychotic features: Secondary | ICD-10-CM | POA: Diagnosis not present

## 2023-02-11 DIAGNOSIS — F332 Major depressive disorder, recurrent severe without psychotic features: Secondary | ICD-10-CM | POA: Diagnosis not present

## 2023-02-11 DIAGNOSIS — F410 Panic disorder [episodic paroxysmal anxiety] without agoraphobia: Secondary | ICD-10-CM | POA: Diagnosis not present

## 2023-02-11 DIAGNOSIS — F411 Generalized anxiety disorder: Secondary | ICD-10-CM | POA: Diagnosis not present

## 2023-02-11 DIAGNOSIS — F909 Attention-deficit hyperactivity disorder, unspecified type: Secondary | ICD-10-CM | POA: Diagnosis not present

## 2023-02-20 DIAGNOSIS — F332 Major depressive disorder, recurrent severe without psychotic features: Secondary | ICD-10-CM | POA: Diagnosis not present

## 2023-02-20 DIAGNOSIS — F411 Generalized anxiety disorder: Secondary | ICD-10-CM | POA: Diagnosis not present

## 2023-02-20 DIAGNOSIS — F410 Panic disorder [episodic paroxysmal anxiety] without agoraphobia: Secondary | ICD-10-CM | POA: Diagnosis not present

## 2023-03-03 DIAGNOSIS — E7522 Gaucher disease: Secondary | ICD-10-CM | POA: Diagnosis not present

## 2023-03-03 DIAGNOSIS — I517 Cardiomegaly: Secondary | ICD-10-CM | POA: Diagnosis not present

## 2023-03-07 DIAGNOSIS — F410 Panic disorder [episodic paroxysmal anxiety] without agoraphobia: Secondary | ICD-10-CM | POA: Diagnosis not present

## 2023-03-07 DIAGNOSIS — F411 Generalized anxiety disorder: Secondary | ICD-10-CM | POA: Diagnosis not present

## 2023-03-07 DIAGNOSIS — F332 Major depressive disorder, recurrent severe without psychotic features: Secondary | ICD-10-CM | POA: Diagnosis not present

## 2023-03-11 DIAGNOSIS — F909 Attention-deficit hyperactivity disorder, unspecified type: Secondary | ICD-10-CM | POA: Diagnosis not present

## 2023-03-11 DIAGNOSIS — F332 Major depressive disorder, recurrent severe without psychotic features: Secondary | ICD-10-CM | POA: Diagnosis not present

## 2023-03-11 DIAGNOSIS — F410 Panic disorder [episodic paroxysmal anxiety] without agoraphobia: Secondary | ICD-10-CM | POA: Diagnosis not present

## 2023-03-27 DIAGNOSIS — F410 Panic disorder [episodic paroxysmal anxiety] without agoraphobia: Secondary | ICD-10-CM | POA: Diagnosis not present

## 2023-03-27 DIAGNOSIS — F411 Generalized anxiety disorder: Secondary | ICD-10-CM | POA: Diagnosis not present

## 2023-03-27 DIAGNOSIS — F332 Major depressive disorder, recurrent severe without psychotic features: Secondary | ICD-10-CM | POA: Diagnosis not present

## 2023-04-10 DIAGNOSIS — F332 Major depressive disorder, recurrent severe without psychotic features: Secondary | ICD-10-CM | POA: Diagnosis not present

## 2023-04-10 DIAGNOSIS — F410 Panic disorder [episodic paroxysmal anxiety] without agoraphobia: Secondary | ICD-10-CM | POA: Diagnosis not present

## 2023-04-10 DIAGNOSIS — F411 Generalized anxiety disorder: Secondary | ICD-10-CM | POA: Diagnosis not present

## 2023-05-07 DIAGNOSIS — F410 Panic disorder [episodic paroxysmal anxiety] without agoraphobia: Secondary | ICD-10-CM | POA: Diagnosis not present

## 2023-05-07 DIAGNOSIS — F411 Generalized anxiety disorder: Secondary | ICD-10-CM | POA: Diagnosis not present

## 2023-05-07 DIAGNOSIS — F332 Major depressive disorder, recurrent severe without psychotic features: Secondary | ICD-10-CM | POA: Diagnosis not present

## 2023-05-23 DIAGNOSIS — F410 Panic disorder [episodic paroxysmal anxiety] without agoraphobia: Secondary | ICD-10-CM | POA: Diagnosis not present

## 2023-05-23 DIAGNOSIS — F411 Generalized anxiety disorder: Secondary | ICD-10-CM | POA: Diagnosis not present

## 2023-05-23 DIAGNOSIS — F332 Major depressive disorder, recurrent severe without psychotic features: Secondary | ICD-10-CM | POA: Diagnosis not present

## 2023-06-02 DIAGNOSIS — F411 Generalized anxiety disorder: Secondary | ICD-10-CM | POA: Diagnosis not present

## 2023-06-02 DIAGNOSIS — F332 Major depressive disorder, recurrent severe without psychotic features: Secondary | ICD-10-CM | POA: Diagnosis not present

## 2023-06-02 DIAGNOSIS — F410 Panic disorder [episodic paroxysmal anxiety] without agoraphobia: Secondary | ICD-10-CM | POA: Diagnosis not present

## 2023-06-09 DIAGNOSIS — F332 Major depressive disorder, recurrent severe without psychotic features: Secondary | ICD-10-CM | POA: Diagnosis not present

## 2023-06-09 DIAGNOSIS — F410 Panic disorder [episodic paroxysmal anxiety] without agoraphobia: Secondary | ICD-10-CM | POA: Diagnosis not present

## 2023-06-09 DIAGNOSIS — F1190 Opioid use, unspecified, uncomplicated: Secondary | ICD-10-CM | POA: Diagnosis not present

## 2023-06-09 DIAGNOSIS — F900 Attention-deficit hyperactivity disorder, predominantly inattentive type: Secondary | ICD-10-CM | POA: Diagnosis not present

## 2023-06-13 DIAGNOSIS — F332 Major depressive disorder, recurrent severe without psychotic features: Secondary | ICD-10-CM | POA: Diagnosis not present

## 2023-06-13 DIAGNOSIS — F411 Generalized anxiety disorder: Secondary | ICD-10-CM | POA: Diagnosis not present

## 2023-06-13 DIAGNOSIS — F410 Panic disorder [episodic paroxysmal anxiety] without agoraphobia: Secondary | ICD-10-CM | POA: Diagnosis not present

## 2023-07-11 DIAGNOSIS — F410 Panic disorder [episodic paroxysmal anxiety] without agoraphobia: Secondary | ICD-10-CM | POA: Diagnosis not present

## 2023-07-11 DIAGNOSIS — F411 Generalized anxiety disorder: Secondary | ICD-10-CM | POA: Diagnosis not present

## 2023-07-11 DIAGNOSIS — F332 Major depressive disorder, recurrent severe without psychotic features: Secondary | ICD-10-CM | POA: Diagnosis not present

## 2023-07-25 DIAGNOSIS — F411 Generalized anxiety disorder: Secondary | ICD-10-CM | POA: Diagnosis not present

## 2023-07-25 DIAGNOSIS — F332 Major depressive disorder, recurrent severe without psychotic features: Secondary | ICD-10-CM | POA: Diagnosis not present

## 2023-07-25 DIAGNOSIS — F410 Panic disorder [episodic paroxysmal anxiety] without agoraphobia: Secondary | ICD-10-CM | POA: Diagnosis not present

## 2023-08-15 DIAGNOSIS — F410 Panic disorder [episodic paroxysmal anxiety] without agoraphobia: Secondary | ICD-10-CM | POA: Diagnosis not present

## 2023-08-15 DIAGNOSIS — F332 Major depressive disorder, recurrent severe without psychotic features: Secondary | ICD-10-CM | POA: Diagnosis not present

## 2023-08-15 DIAGNOSIS — F411 Generalized anxiety disorder: Secondary | ICD-10-CM | POA: Diagnosis not present

## 2023-08-22 DIAGNOSIS — F411 Generalized anxiety disorder: Secondary | ICD-10-CM | POA: Diagnosis not present

## 2023-08-22 DIAGNOSIS — F410 Panic disorder [episodic paroxysmal anxiety] without agoraphobia: Secondary | ICD-10-CM | POA: Diagnosis not present

## 2023-08-22 DIAGNOSIS — F332 Major depressive disorder, recurrent severe without psychotic features: Secondary | ICD-10-CM | POA: Diagnosis not present

## 2023-09-11 DIAGNOSIS — F410 Panic disorder [episodic paroxysmal anxiety] without agoraphobia: Secondary | ICD-10-CM | POA: Diagnosis not present

## 2023-09-11 DIAGNOSIS — F411 Generalized anxiety disorder: Secondary | ICD-10-CM | POA: Diagnosis not present

## 2023-09-11 DIAGNOSIS — F332 Major depressive disorder, recurrent severe without psychotic features: Secondary | ICD-10-CM | POA: Diagnosis not present

## 2023-09-11 DIAGNOSIS — F909 Attention-deficit hyperactivity disorder, unspecified type: Secondary | ICD-10-CM | POA: Diagnosis not present

## 2023-09-19 DIAGNOSIS — F411 Generalized anxiety disorder: Secondary | ICD-10-CM | POA: Diagnosis not present

## 2023-09-19 DIAGNOSIS — F332 Major depressive disorder, recurrent severe without psychotic features: Secondary | ICD-10-CM | POA: Diagnosis not present

## 2023-09-19 DIAGNOSIS — F410 Panic disorder [episodic paroxysmal anxiety] without agoraphobia: Secondary | ICD-10-CM | POA: Diagnosis not present

## 2023-10-03 DIAGNOSIS — F410 Panic disorder [episodic paroxysmal anxiety] without agoraphobia: Secondary | ICD-10-CM | POA: Diagnosis not present

## 2023-10-03 DIAGNOSIS — F332 Major depressive disorder, recurrent severe without psychotic features: Secondary | ICD-10-CM | POA: Diagnosis not present

## 2023-10-03 DIAGNOSIS — F411 Generalized anxiety disorder: Secondary | ICD-10-CM | POA: Diagnosis not present

## 2023-10-17 DIAGNOSIS — F411 Generalized anxiety disorder: Secondary | ICD-10-CM | POA: Diagnosis not present

## 2023-10-17 DIAGNOSIS — F410 Panic disorder [episodic paroxysmal anxiety] without agoraphobia: Secondary | ICD-10-CM | POA: Diagnosis not present

## 2023-10-17 DIAGNOSIS — F332 Major depressive disorder, recurrent severe without psychotic features: Secondary | ICD-10-CM | POA: Diagnosis not present

## 2023-10-31 DIAGNOSIS — F332 Major depressive disorder, recurrent severe without psychotic features: Secondary | ICD-10-CM | POA: Diagnosis not present

## 2023-10-31 DIAGNOSIS — F410 Panic disorder [episodic paroxysmal anxiety] without agoraphobia: Secondary | ICD-10-CM | POA: Diagnosis not present

## 2023-10-31 DIAGNOSIS — F411 Generalized anxiety disorder: Secondary | ICD-10-CM | POA: Diagnosis not present

## 2023-11-21 DIAGNOSIS — F410 Panic disorder [episodic paroxysmal anxiety] without agoraphobia: Secondary | ICD-10-CM | POA: Diagnosis not present

## 2023-11-21 DIAGNOSIS — F411 Generalized anxiety disorder: Secondary | ICD-10-CM | POA: Diagnosis not present

## 2023-11-21 DIAGNOSIS — F332 Major depressive disorder, recurrent severe without psychotic features: Secondary | ICD-10-CM | POA: Diagnosis not present

## 2023-12-22 ENCOUNTER — Other Ambulatory Visit: Payer: Self-pay | Admitting: Obstetrics and Gynecology

## 2023-12-22 DIAGNOSIS — Z1231 Encounter for screening mammogram for malignant neoplasm of breast: Secondary | ICD-10-CM
